# Patient Record
Sex: Female | Born: 1970 | Race: White | Hispanic: No | Marital: Married | Smoking: Never smoker
Health system: Southern US, Community
[De-identification: ages and names within clinical notes are randomized; demographics above are authoritative.]

## PROBLEM LIST (undated history)

## (undated) ENCOUNTER — Ambulatory Visit

## (undated) DIAGNOSIS — G5622 Lesion of ulnar nerve, left upper limb: Secondary | ICD-10-CM

## (undated) HISTORY — DX: Lesion of ulnar nerve, left upper limb: G56.22

## (undated) HISTORY — PX: WISDOM TOOTH EXTRACTION: SHX21

---

## 2009-06-13 ENCOUNTER — Ambulatory Visit: Payer: Self-pay | Admitting: Occupational Medicine

## 2009-10-13 ENCOUNTER — Ambulatory Visit: Payer: Self-pay | Admitting: Family Medicine

## 2009-10-13 LAB — CONVERTED CEMR LAB
Bilirubin Urine: NEGATIVE
Nitrite: NEGATIVE
Urobilinogen, UA: 0.2
WBC Urine, dipstick: NEGATIVE
pH: 5.5

## 2011-09-05 ENCOUNTER — Emergency Department (HOSPITAL_BASED_OUTPATIENT_CLINIC_OR_DEPARTMENT_OTHER)
Admission: EM | Admit: 2011-09-05 | Discharge: 2011-09-05 | Disposition: A | Discharge status: Home / Self Care | Payer: 59 | Attending: Emergency Medicine | Admitting: Emergency Medicine

## 2011-09-05 ENCOUNTER — Encounter: Payer: Self-pay | Admitting: *Deleted

## 2011-09-05 ENCOUNTER — Inpatient Hospital Stay (INDEPENDENT_AMBULATORY_CARE_PROVIDER_SITE_OTHER)
Admission: RE | Admit: 2011-09-05 | Discharge: 2011-09-05 | Disposition: A | Discharge status: Home / Self Care | Payer: 59 | Source: Ambulatory Visit | Attending: Family Medicine | Admitting: Family Medicine

## 2011-09-05 ENCOUNTER — Encounter: Payer: Self-pay | Admitting: Family Medicine

## 2011-09-05 DIAGNOSIS — IMO0001 Reserved for inherently not codable concepts without codable children: Secondary | ICD-10-CM | POA: Insufficient documentation

## 2011-09-05 DIAGNOSIS — W5501XA Bitten by cat, initial encounter: Secondary | ICD-10-CM

## 2011-09-05 DIAGNOSIS — IMO0002 Reserved for concepts with insufficient information to code with codable children: Secondary | ICD-10-CM | POA: Insufficient documentation

## 2011-09-05 DIAGNOSIS — Y92009 Unspecified place in unspecified non-institutional (private) residence as the place of occurrence of the external cause: Secondary | ICD-10-CM | POA: Insufficient documentation

## 2011-09-05 DIAGNOSIS — T07XXXA Unspecified multiple injuries, initial encounter: Secondary | ICD-10-CM

## 2011-09-05 NOTE — ED Notes (Signed)
Stray cat bite on R hand, patient has cat at her house

## 2011-09-05 NOTE — ED Provider Notes (Signed)
History     CSN: [id number] Arrival date & time: 09/05/2011  8:52 AM  Chief Complaint  Patient presents with  . Animal Bite    (Consider location/radiation/quality/duration/timing/severity/associated sxs/prior treatment) Patient is a 39 y.o. female presenting with animal bite. The history is provided by the patient.  Animal Bite    pleasant female with no past medical history. She was scratched or bitten on the right wrist by a stray cat that she found that she is planning to keep. Patient was concerned that the kitten would need to be used denies. However the cat has been acting normal. She has had no fevers no nausea no vomiting no redness or any streaking in the arm. Will talk with registration and have patient fill out forms to observe the cat. Her tetanus booster is up to date. It is very unlikely the patient will need the rabies vaccine series.  History reviewed. No pertinent past medical history.  Past Surgical History  Procedure Date  . Wisdom tooth extraction     No family history on file.  History  Substance Use Topics  . Smoking status: Never Smoker   . Smokeless tobacco: Not on file  . Alcohol Use: Yes    OB History    Grav Para Term Preterm Abortions TAB SAB Ect Mult Living                  Review of Systems  All other systems reviewed and are negative.    Allergies  Review of patient's allergies indicates no known allergies.  Home Medications  No current outpatient prescriptions on file.  BP 119/75  Pulse 86  Temp(Src) 97.9 F (36.6 C) (Oral)  Resp 16  SpO2 100%  Physical Exam  Constitutional: She is oriented to person, place, and time. She appears well-developed and well-nourished.  HENT:  Head: Normocephalic and atraumatic.  Eyes: Conjunctivae and EOM are normal. Pupils are equal, round, and reactive to light.  Neck: Neck supple.  Cardiovascular: Normal rate and regular rhythm.  Exam reveals no gallop and no friction rub.   No murmur  heard. Pulmonary/Chest: Breath sounds normal. She has no wheezes. She has no rales. She exhibits no tenderness.  Abdominal: Soft. Bowel sounds are normal. She exhibits no distension. There is no tenderness. There is no rebound and no guarding.  Musculoskeletal: Normal range of motion.  Neurological: She is alert and oriented to person, place, and time. No cranial nerve deficit. Coordination normal.  Skin: Skin is warm and dry. No rash noted.       Very small abrasion to the right lateral wrist. There is no surrounding erythema there is NO fluctuance. There is NO crepitus. There is no lymphangitis.  Psychiatric: She has a normal mood and affect.    ED Course  Procedures (including critical care time)  Labs Reviewed - No data to display No results found.   No diagnosis found.    MDM  Pt is seen and examined;  Initial history and physical completed.  Will follow.          Lithzy Bernard A. Patrica Duel, MD 09/05/11 [phone]

## 2011-09-05 NOTE — ED Notes (Signed)
I irrigated wound with i.v. Tube and saline. I then applied bacitracin, then wrapped kerlix over 2x2's.

## 2011-11-05 NOTE — Progress Notes (Signed)
Summary: animal bite on r wrist/wb   Vital Signs:  Patient Profile:   40 Years Old Female Height:     66 inches Weight:      146 pounds O2 Sat:      100 % O2 treatment:    Room Air Temp:     97.6 degrees F oral Pulse rate:   83 / minute Pulse rhythm:   regular Resp:     16 per minute  Vitals Entered By: Emilio Math (September 05, 2011 8:25 AM)                  Current Allergies: No known allergies    Objective:  Patient not examined Assessment New Problems: ANIMAL BITE (ICD-919.8)  PATIENT SENT TO ER BECAUSE OF POSSIBLE RABIES EXPOSURE  The patient and/or caregiver has been counseled thoroughly with regard to medications prescribed including dosage, schedule, interactions, rationale for use, and possible side effects and they verbalize understanding.  Diagnoses and expected course of recovery discussed and will return if not improved as expected or if the condition worsens. Patient and/or caregiver verbalized understanding.   Orders Added: 1)  No Charge Patient Arrived (NCPA0) [NCPA0]

## 2012-05-28 ENCOUNTER — Other Ambulatory Visit (HOSPITAL_COMMUNITY)
Admission: RE | Admit: 2012-05-28 | Discharge: 2012-05-28 | Disposition: A | Discharge status: Home / Self Care | Payer: 59 | Source: Ambulatory Visit | Attending: Obstetrics & Gynecology | Admitting: Obstetrics & Gynecology

## 2012-05-28 ENCOUNTER — Other Ambulatory Visit (HOSPITAL_COMMUNITY): Payer: Self-pay | Admitting: Obstetrics & Gynecology

## 2012-05-28 DIAGNOSIS — Z3480 Encounter for supervision of other normal pregnancy, unspecified trimester: Secondary | ICD-10-CM | POA: Insufficient documentation

## 2012-05-28 DIAGNOSIS — Q513 Bicornate uterus: Secondary | ICD-10-CM

## 2012-05-28 DIAGNOSIS — Z01419 Encounter for gynecological examination (general) (routine) without abnormal findings: Secondary | ICD-10-CM | POA: Insufficient documentation

## 2012-05-28 DIAGNOSIS — Z113 Encounter for screening for infections with a predominantly sexual mode of transmission: Secondary | ICD-10-CM | POA: Insufficient documentation

## 2012-05-28 DIAGNOSIS — Z3682 Encounter for antenatal screening for nuchal translucency: Secondary | ICD-10-CM

## 2012-05-28 DIAGNOSIS — Z1151 Encounter for screening for human papillomavirus (HPV): Secondary | ICD-10-CM | POA: Insufficient documentation

## 2012-06-16 ENCOUNTER — Encounter (HOSPITAL_COMMUNITY): Payer: Self-pay | Admitting: Obstetrics & Gynecology

## 2012-06-25 ENCOUNTER — Ambulatory Visit (HOSPITAL_COMMUNITY)
Admission: RE | Admit: 2012-06-25 | Discharge: 2012-06-25 | Disposition: A | Discharge status: Home / Self Care | Payer: 59 | Source: Ambulatory Visit | Attending: Obstetrics & Gynecology | Admitting: Obstetrics & Gynecology

## 2012-06-25 ENCOUNTER — Encounter (HOSPITAL_COMMUNITY): Payer: Self-pay

## 2012-06-25 ENCOUNTER — Other Ambulatory Visit: Payer: Self-pay

## 2012-06-25 DIAGNOSIS — Z8751 Personal history of pre-term labor: Secondary | ICD-10-CM | POA: Insufficient documentation

## 2012-06-25 DIAGNOSIS — O352XX0 Maternal care for (suspected) hereditary disease in fetus, not applicable or unspecified: Secondary | ICD-10-CM | POA: Insufficient documentation

## 2012-06-25 DIAGNOSIS — Q513 Bicornate uterus: Secondary | ICD-10-CM

## 2012-06-25 DIAGNOSIS — Z3682 Encounter for antenatal screening for nuchal translucency: Secondary | ICD-10-CM

## 2012-06-25 DIAGNOSIS — O09529 Supervision of elderly multigravida, unspecified trimester: Secondary | ICD-10-CM

## 2012-06-25 NOTE — Progress Notes (Signed)
Genetic Counseling  High-Risk Gestation Note  Appointment Date:  06/25/2012 Referred By: Delbert Harness, MD Date of Birth:  [birth year]  Pregnancy History: [id number] Estimated Date of Delivery: 01/07/13 Estimated Gestational Age: [redacted]w[redacted]d Attending: Particia Nearing, MD    Cynthia Alvarez was seen for genetic counseling because of a maternal age of 41.   She was counseled regarding maternal age and the association with risk for chromosome conditions due to nondisjunction with aging of the ova.   We reviewed chromosomes, nondisjunction, and the associated 1 in 50 risk for fetal aneuploidy related to a maternal age of 41 y.o. at [redacted]w[redacted]d gestation.  She was counseled that the risk for aneuploidy decreases as gestational age increases, accounting for those pregnancies which spontaneously abort.  We specifically discussed Down syndrome (trisomy 20), trisomies 24 and 72, and sex chromosome aneuploidies (47,XXX and 47,XXY) including the common features and prognoses of each.   We reviewed available screening options including First screen, Quad screen, noninvasive prenatal testing (NIPT), and detailed ultrasound. She understands that screening tests are used to modify a patient's a priori risk for aneuploidy, typically based on age.  This estimate provides a pregnancy specific risk assessment.  Specifically, we discussed that First screen is a combined test in which specific ultrasound measurements and pregnancy specific protein analysis detects approximately 95% of fetuses with trisomies 21, 13, and 18.  The reported false positive rate is ~5%.  We then discussed that NIPT analyzes cell free fetal DNA found in the maternal circulation. This test is not diagnostic for chromosome conditions, but can provide information regarding the presence or absence of extra fetal DNA for chromosomes 13, 18, 21, X, and Y, and missing fetal DNA for chromosome X and Y (Turner syndrome). Thus, it would not identify or rule out all  genetic conditions. The reported detection rate is greater than 99% for Trisomy 21, greater than 98% for Trisomy 18, and is approximately 80% (8 out of 10) for Trisomy 13. The false positive rate is reported to be less than 0.1% for any of these conditions.  In addition, we discussed that ~50-80% of fetuses with Down syndrome and up to 90-95% of fetuses with trisomy 18/13, when well visualized, have detectable anomalies or soft markers by detailed ultrasound (~18+ weeks gestation).   She was also counseled regarding diagnostic testing via CVS or amniocentesis.  We reviewed the associated risk for complications for each, including spontaneous pregnancy loss. We discussed the risks, limitations, and benefits of each screening and testing option. After consideration of all the options, [name] elected to proceed with First trimester screening. Those results will be available in ~5 business days.  A nuchal translucency ultrasound was performed today. The result will be documented separately.  [name] was provided with written information regarding cystic fibrosis (CF) including the carrier frequency and incidence in the Caucasian population, the availability of carrier testing and prenatal diagnosis if indicated.  In addition, we discussed that CF is routinely screened for as part of the [place] newborn screening panel.  She declined testing today.   Both family histories were reviewed and found to be contributory for [name]'s second child having sagittal craniosynostosis.  This child reportedly had surgery at 21 months of age.  He has had no other medical or developmental concerns.  He has never been evaluated by a medical geneticist.  She was counseled that the majority of cases of craniosynostosis are multifactorial in etiology; however, craniosynostosis can be a feature of an  underlying genetic syndrome.  Assuming multifactorial inheritance, the risk of recurrence is estimated to be ~3-5%.  If her son  has a specific undiagnosed syndrome, the risk of recurrence depends on the inheritance of the syndrome. The remainder of the reported family history was noncontributory for birth defects, mental retardation, and known genetic conditions. Without further information regarding the provided family history, an accurate genetic risk cannot be calculated. Further genetic counseling is warranted if more information is obtained.  Mrs. Backs denied exposure to environmental toxins or chemical agents. She denied the use of alcohol, tobacco or street drugs. She denied significant viral illnesses during the course of her pregnancy.   I counseled [name] regarding the above risks and available options.  The approximate face-to-face time with the genetic counselor was 35 minutes.  Despina Arias, MS  Certified Genetic Counselor

## 2012-06-27 LAB — OB RESULTS CONSOLE HEPATITIS B SURFACE ANTIGEN: Hepatitis B Surface Ag: NEGATIVE

## 2012-06-27 LAB — OB RESULTS CONSOLE ABO/RH: RH Type: NEGATIVE

## 2012-06-27 LAB — OB RESULTS CONSOLE ANTIBODY SCREEN: Antibody Screen: NEGATIVE

## 2012-08-06 ENCOUNTER — Other Ambulatory Visit (HOSPITAL_COMMUNITY): Payer: Self-pay

## 2012-08-08 ENCOUNTER — Ambulatory Visit (HOSPITAL_COMMUNITY)
Admission: RE | Admit: 2012-08-08 | Discharge: 2012-08-08 | Disposition: A | Discharge status: Home / Self Care | Payer: Commercial Managed Care - PPO | Source: Ambulatory Visit | Attending: Obstetrics & Gynecology | Admitting: Obstetrics & Gynecology

## 2012-08-08 DIAGNOSIS — Z363 Encounter for antenatal screening for malformations: Secondary | ICD-10-CM | POA: Insufficient documentation

## 2012-08-08 DIAGNOSIS — Z8751 Personal history of pre-term labor: Secondary | ICD-10-CM | POA: Insufficient documentation

## 2012-08-08 DIAGNOSIS — O3400 Maternal care for unspecified congenital malformation of uterus, unspecified trimester: Secondary | ICD-10-CM | POA: Insufficient documentation

## 2012-08-08 DIAGNOSIS — O09529 Supervision of elderly multigravida, unspecified trimester: Secondary | ICD-10-CM

## 2012-08-08 DIAGNOSIS — O358XX0 Maternal care for other (suspected) fetal abnormality and damage, not applicable or unspecified: Secondary | ICD-10-CM | POA: Insufficient documentation

## 2012-08-08 DIAGNOSIS — Z1389 Encounter for screening for other disorder: Secondary | ICD-10-CM | POA: Insufficient documentation

## 2012-08-08 DIAGNOSIS — O352XX0 Maternal care for (suspected) hereditary disease in fetus, not applicable or unspecified: Secondary | ICD-10-CM | POA: Insufficient documentation

## 2012-09-12 ENCOUNTER — Encounter: Payer: Self-pay | Admitting: Family Medicine

## 2012-09-12 ENCOUNTER — Ambulatory Visit (INDEPENDENT_AMBULATORY_CARE_PROVIDER_SITE_OTHER): Payer: 59 | Admitting: Family Medicine

## 2012-09-12 VITALS — BP 110/67 | HR 91 | Temp 97.4°F | Resp 18 | Ht 66.0 in | Wt 154.0 lb

## 2012-09-12 DIAGNOSIS — G5622 Lesion of ulnar nerve, left upper limb: Secondary | ICD-10-CM | POA: Insufficient documentation

## 2012-09-12 DIAGNOSIS — G5620 Lesion of ulnar nerve, unspecified upper limb: Secondary | ICD-10-CM

## 2012-09-12 HISTORY — DX: Lesion of ulnar nerve, left upper limb: G56.22

## 2012-09-12 NOTE — Progress Notes (Signed)
CC: Cynthia Alvarez is a 41 y.o. female is here for Tingling   Subjective: HPI:  Pleasant 41 year old female approximately [redacted] weeks gestation presents with a complaint of left elbow pain and numbness in the left forearm is been present for a little over 12 hours. It came on somewhat suddenly while watching TV last night and was not preceded by any trauma recently normally. She's never had this discomfort before. She has had 3 dislocations of left shoulder many years ago without any neurological issues or motor/sensory disturbances.  She describes a tingling sensation that has been running from the medial elbow distally into the fourth and fifth digit. She denies weakness in the elbow wrist flexors wrist extensors nor anywhere in her fingers or hand. She states the pain and discomfort is identical to that when she has hit her "funny bone". It has improved this morning and it may be because of taking Tylenol but there been no other interventions. She states she was always sleeps on her left side with her elbow flexed. She denies skin changes in the affected extremity, swollen joints, redness of the joints. She denies left shoulder pain left wrist pain nor neck pain.   Review Of Systems Outlined In HPI  History reviewed. No pertinent past medical history.   Family History  Problem Relation Age of Onset  . Hypertension Mother   . Heart attack Maternal Grandmother   . Heart attack Maternal Grandfather   . Stroke Maternal Grandfather   . Heart attack Paternal Grandmother   . Heart attack Paternal Grandfather      History  Substance Use Topics  . Smoking status: Never Smoker   . Smokeless tobacco: Never Used  . Alcohol Use: No     Objective: Filed Vitals:   09/12/12 1018  BP: 110/67  Pulse: 91  Temp: 97.4 F (36.3 C)  Resp: 18    General: Alert and Oriented, No Acute Distress Neck: Full range of motion strength Lungs: Clear to auscultation bilaterally, no wheezing/ronchi/rales.   Comfortable work of breathing.  Extremities: No peripheral edema.  Strong peripheral pulses.  MSK: Left upper extremity: Full range of motion and strength in the fingers, wrist, elbow, shoulder. Light touch sensation is preserved in all upper extremity dermatomes. No pain with valgus or varus stressing of the elbow. No palpable pain at the articular surfaces of the ulna, radius, humerus. No pain with resisted wrist extension nor flexion or palpation of the medial or lateral humeral condyle. Tapping of the ulnar canal causes mild discomfort similar to her presenting discomfort. There's no reproduction of symptoms with Phalen's test. Mental Status: No depression, anxiety, nor agitation. Skin: Warm and dry.  Assessment & Plan: Cynthia Alvarez was seen today for tingling.  Diagnoses and associated orders for this visit:  Ulnar neuropathy of left upper extremity  History and presentation suggestive of ulnar neuropathy, suspicious of cubital tunnel syndrome therefore conservative therapy will consist of discouraging the left elbow from being in flexion. Without trauma I doubt x-rays be of any utility especially given that she's pregnant. He consider nerve conduction studies if conservative therapy does not help, she'll return next week for construction of a fiberglass splint if she doesn't improve with a towel splint wrapped in Ace bandages to be worn at times when she would be in elbow flexion such as sleeping and resting.  Return if symptoms worsen or fail to improve.

## 2012-10-16 LAB — OB RESULTS CONSOLE RPR: RPR: NONREACTIVE

## 2012-12-13 ENCOUNTER — Encounter (HOSPITAL_COMMUNITY): Payer: Self-pay | Admitting: *Deleted

## 2012-12-13 ENCOUNTER — Inpatient Hospital Stay (HOSPITAL_COMMUNITY)
Admission: AD | Admit: 2012-12-13 | Discharge: 2012-12-13 | Disposition: A | Discharge status: Home / Self Care | Payer: 59 | Source: Ambulatory Visit | Attending: Obstetrics & Gynecology | Admitting: Obstetrics & Gynecology

## 2012-12-13 DIAGNOSIS — O4700 False labor before 37 completed weeks of gestation, unspecified trimester: Secondary | ICD-10-CM | POA: Insufficient documentation

## 2012-12-13 NOTE — Progress Notes (Signed)
No pain with contractions - only pressure

## 2012-12-13 NOTE — MAU Note (Signed)
I've been contracting all day but stronger since 1730. No bleeding or leaking. Last delivery water broke at 35+wks and delivered that day

## 2012-12-20 ENCOUNTER — Inpatient Hospital Stay (HOSPITAL_COMMUNITY)
Admission: AD | Admit: 2012-12-20 | Discharge: 2012-12-22 | DRG: 775 | Disposition: A | Discharge status: Home / Self Care | Payer: 59 | Source: Ambulatory Visit | Attending: Obstetrics and Gynecology | Admitting: Obstetrics and Gynecology

## 2012-12-20 ENCOUNTER — Encounter (HOSPITAL_COMMUNITY): Payer: Self-pay | Admitting: Obstetrics and Gynecology

## 2012-12-20 DIAGNOSIS — O3400 Maternal care for unspecified congenital malformation of uterus, unspecified trimester: Secondary | ICD-10-CM | POA: Diagnosis present

## 2012-12-20 DIAGNOSIS — O09529 Supervision of elderly multigravida, unspecified trimester: Principal | ICD-10-CM | POA: Diagnosis present

## 2012-12-20 DIAGNOSIS — Q513 Bicornate uterus: Secondary | ICD-10-CM

## 2012-12-20 LAB — CBC
HCT: 35.1 % — ABNORMAL LOW (ref 36.0–46.0)
MCH: 31.9 pg (ref 26.0–34.0)
MCHC: 34.8 g/dL (ref 30.0–36.0)
MCV: 91.6 fL (ref 78.0–100.0)
RDW: 14.8 % (ref 11.5–15.5)

## 2012-12-20 MED ORDER — OXYTOCIN BOLUS FROM INFUSION
500.0000 mL | INTRAVENOUS | Status: DC
  Administered 2012-12-21: 500 mL via INTRAVENOUS

## 2012-12-20 MED ORDER — IBUPROFEN 600 MG PO TABS
600.0000 mg | ORAL_TABLET | Freq: Four times a day (QID) | ORAL | Status: DC | PRN
  Administered 2012-12-21: 600 mg via ORAL
  Filled 2012-12-20: qty 1

## 2012-12-20 MED ORDER — ONDANSETRON HCL 4 MG/2ML IJ SOLN: 4.0000 mg | Freq: Four times a day (QID) | INTRAMUSCULAR | Status: DC | PRN

## 2012-12-20 MED ORDER — LIDOCAINE HCL (PF) 1 % IJ SOLN
30.0000 mL | INTRAMUSCULAR | Status: DC | PRN
  Filled 2012-12-20: qty 30

## 2012-12-20 MED ORDER — LACTATED RINGERS IV SOLN
INTRAVENOUS | Status: DC
  Administered 2012-12-20: 22:00:00 via INTRAVENOUS

## 2012-12-20 MED ORDER — CITRIC ACID-SODIUM CITRATE 334-500 MG/5ML PO SOLN: 30.0000 mL | ORAL | Status: DC | PRN

## 2012-12-20 MED ORDER — ACETAMINOPHEN 325 MG PO TABS: 650.0000 mg | ORAL_TABLET | ORAL | Status: DC | PRN

## 2012-12-20 MED ORDER — OXYTOCIN 40 UNITS IN LACTATED RINGERS INFUSION - SIMPLE MED
62.5000 mL/h | INTRAVENOUS | Status: DC
  Filled 2012-12-20: qty 1000

## 2012-12-20 MED ORDER — LACTATED RINGERS IV SOLN: 500.0000 mL | INTRAVENOUS | Status: DC | PRN

## 2012-12-20 NOTE — MAU Note (Signed)
"  I have been contracting pretty strongly since about 1700 tonight.  I was seen in the office yesterday and was dilated 3.5cm.  No VB or LOF today.  The baby was rally active this morning, but hasn't been very active in the last couple of hours."

## 2012-12-20 NOTE — MAU Note (Signed)
Cxts all day. Stronger since 1700. No bleeding or leaking. 3.5cm yest.

## 2012-12-21 ENCOUNTER — Encounter (HOSPITAL_COMMUNITY): Payer: Self-pay | Admitting: Obstetrics

## 2012-12-21 LAB — ABO/RH: ABO/RH(D): A NEG

## 2012-12-21 MED ORDER — OXYCODONE-ACETAMINOPHEN 5-325 MG PO TABS: 1.0000 | ORAL_TABLET | ORAL | Status: DC | PRN

## 2012-12-21 MED ORDER — PRENATAL MULTIVITAMIN CH
1.0000 | ORAL_TABLET | Freq: Every day | ORAL | Status: DC
  Administered 2012-12-21 – 2012-12-22 (×2): 1 via ORAL
  Filled 2012-12-21 (×2): qty 1

## 2012-12-21 MED ORDER — DIBUCAINE 1 % RE OINT
1.0000 "application " | TOPICAL_OINTMENT | RECTAL | Status: DC | PRN
  Administered 2012-12-21: 1 via RECTAL
  Filled 2012-12-21: qty 28

## 2012-12-21 MED ORDER — WITCH HAZEL-GLYCERIN EX PADS
1.0000 "application " | MEDICATED_PAD | CUTANEOUS | Status: DC | PRN
  Administered 2012-12-21: 1 via TOPICAL

## 2012-12-21 MED ORDER — MEDROXYPROGESTERONE ACETATE 150 MG/ML IM SUSP: 150.0000 mg | INTRAMUSCULAR | Status: DC | PRN

## 2012-12-21 MED ORDER — MEASLES, MUMPS & RUBELLA VAC ~~LOC~~ INJ
0.5000 mL | INJECTION | Freq: Once | SUBCUTANEOUS | Status: DC
  Filled 2012-12-21: qty 0.5

## 2012-12-21 MED ORDER — ONDANSETRON HCL 4 MG/2ML IJ SOLN: 4.0000 mg | INTRAMUSCULAR | Status: DC | PRN

## 2012-12-21 MED ORDER — BENZOCAINE-MENTHOL 20-0.5 % EX AERO
1.0000 "application " | INHALATION_SPRAY | CUTANEOUS | Status: DC | PRN
  Administered 2012-12-21: 1 via TOPICAL
  Filled 2012-12-21: qty 56

## 2012-12-21 MED ORDER — ZOLPIDEM TARTRATE 5 MG PO TABS: 5.0000 mg | ORAL_TABLET | Freq: Every evening | ORAL | Status: DC | PRN

## 2012-12-21 MED ORDER — DIPHENHYDRAMINE HCL 25 MG PO CAPS: 25.0000 mg | ORAL_CAPSULE | Freq: Four times a day (QID) | ORAL | Status: DC | PRN

## 2012-12-21 MED ORDER — LANOLIN HYDROUS EX OINT: TOPICAL_OINTMENT | CUTANEOUS | Status: DC | PRN

## 2012-12-21 MED ORDER — SENNOSIDES-DOCUSATE SODIUM 8.6-50 MG PO TABS
2.0000 | ORAL_TABLET | Freq: Every day | ORAL | Status: DC
  Administered 2012-12-21: 2 via ORAL

## 2012-12-21 MED ORDER — TETANUS-DIPHTH-ACELL PERTUSSIS 5-2.5-18.5 LF-MCG/0.5 IM SUSP: 0.5000 mL | Freq: Once | INTRAMUSCULAR | Status: DC

## 2012-12-21 MED ORDER — IBUPROFEN 600 MG PO TABS
600.0000 mg | ORAL_TABLET | Freq: Four times a day (QID) | ORAL | Status: DC
  Administered 2012-12-21 – 2012-12-22 (×5): 600 mg via ORAL
  Filled 2012-12-21 (×5): qty 1

## 2012-12-21 MED ORDER — MULTI-VITAMIN/MINERALS PO TABS: 1.0000 | ORAL_TABLET | Freq: Every day | ORAL | Status: DC

## 2012-12-21 MED ORDER — SIMETHICONE 80 MG PO CHEW: 80.0000 mg | CHEWABLE_TABLET | ORAL | Status: DC | PRN

## 2012-12-21 MED ORDER — ONDANSETRON HCL 4 MG PO TABS: 4.0000 mg | ORAL_TABLET | ORAL | Status: DC | PRN

## 2012-12-21 NOTE — H&P (Signed)
Cynthia Alvarez is a 42 y.o. female presenting for labor at 36 1/2 weeks.  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  She received 17 P injections until 35 weeks.  OB History    Grav Para Term Preterm Abortions TAB SAB Ect Mult Living   5 5 4 1  0 0 0 0 0 5     History reviewed. No pertinent past medical history. Past Surgical History  Procedure Date  . Wisdom tooth extraction    Family History: family history includes Heart attack in her maternal grandfather, maternal grandmother, paternal grandfather, and paternal grandmother; Hypertension in her mother; and Stroke in her maternal grandfather. Social History:  reports that she has never smoked. She has never used smokeless tobacco. She reports that she does not drink alcohol or use illicit drugs.  Non contributory  Physical exam:  General  Well develped, no distress Heart  S1 and S2 clear Lungs clear  Abd BS present Term gravida Ext normal with out swelling, discoloration  Cx on admission 3.5 cm   Blood pressure 119/74, pulse 82, temperature 98.2 F (36.8 C), temperature source Oral, resp. rate 18, height 5\' 6"  (1.676 m), weight 77.111 kg (170 lb), last menstrual period 04/02/2012, unknown if currently breastfeeding.  Prenatal labs: ABO, Rh: --/--/A NEG (01/19 0347) Antibody: POS (01/19 0347) Rubella:   RPR: NON REACTIVE (01/18 2200)  HBsAg: Negative (07/26 0000)  HIV: Non-reactive (11/14 0000)  GBS: Negative (01/10 0000)   Assessment/Plan: Term, labor  Expectant care   Bonne Whack E 12/21/2012, 5:37 AM

## 2012-12-21 NOTE — Progress Notes (Signed)
Delivery Note At 4:55 AM a viable female was delivered via Vaginal, Spontaneous Delivery (Presentation: Middle Occiput Posterior).  APGAR: 8, 9; weight .   Placenta status: Intact, Spontaneous.  Cord: 3 vessels with the following complications: None.    Anesthesia: None  Episiotomy: None Lacerations: None Suture Repair: no Est. Blood Loss (mL): 450  Mom to postpartum.  Baby to nursery-stable.  Mignonne Afonso E 12/21/2012, 5:46 AM

## 2012-12-22 LAB — TYPE AND SCREEN
ABO/RH(D): A NEG
DAT, IgG: NEGATIVE
Unit division: 0

## 2012-12-22 LAB — CBC
HCT: 31.7 % — ABNORMAL LOW (ref 36.0–46.0)
Hemoglobin: 10.7 g/dL — ABNORMAL LOW (ref 12.0–15.0)
MCHC: 33.8 g/dL (ref 30.0–36.0)
MCV: 92.7 fL (ref 78.0–100.0)
WBC: 13.1 10*3/uL — ABNORMAL HIGH (ref 4.0–10.5)

## 2012-12-22 NOTE — Discharge Summary (Signed)
Obstetric Discharge Summary Reason for Admission: onset of labor Prenatal Procedures: none Intrapartum Procedures: spontaneous vaginal delivery Postpartum Procedures: none Complications-Operative and Postpartum: none Hemoglobin  Date Value Range Status  12/22/2012 10.7* 12.0 - 15.0 g/dL Final     HCT  Date Value Range Status  12/22/2012 31.7* 36.0 - 46.0 % Final    Physical Exam:  General: alert, cooperative and no distress Lochia: appropriate Uterine Fundus: no problems Incision: na DVT Evaluation: No evidence of DVT seen on physical exam.  Discharge Diagnoses: Term Pregnancy-delivered  Discharge Information: Date: 12/22/2012 Activity: unrestricted Diet: routine Medications: None Condition: stable Instructions: refer to practice specific booklet Discharge to: home   Newborn Data: Live born female  Birth Weight: 8 lb 5.7 oz (3790 g) APGAR: 8, 9  Home with mother.  Cynthia Gainer H. 12/22/2012, 10:19 AM

## 2013-02-26 ENCOUNTER — Ambulatory Visit (INDEPENDENT_AMBULATORY_CARE_PROVIDER_SITE_OTHER): Payer: 59 | Admitting: Family Medicine

## 2013-02-26 ENCOUNTER — Encounter: Payer: Self-pay | Admitting: Family Medicine

## 2013-02-26 VITALS — BP 94/61 | HR 78 | Wt 143.0 lb

## 2013-02-26 DIAGNOSIS — L259 Unspecified contact dermatitis, unspecified cause: Secondary | ICD-10-CM

## 2013-02-26 DIAGNOSIS — L309 Dermatitis, unspecified: Secondary | ICD-10-CM

## 2013-02-26 MED ORDER — HYDROXYZINE HCL 25 MG PO TABS: 25.0000 mg | ORAL_TABLET | Freq: Three times a day (TID) | ORAL | Status: DC | PRN

## 2013-02-26 MED ORDER — PREDNISONE 20 MG PO TABS: ORAL_TABLET | ORAL | Status: AC

## 2013-02-26 NOTE — Progress Notes (Signed)
CC: Cynthia Alvarez is a 42 y.o. female is here for rash on face   Subjective: HPI:  Patient planes of a rash on the face is been present for about a week and a half. Is described as red, bumpy, and itchy. It came on suddenly on a Tuesday morning. Since then it has slightly gotten from a moderate to mild annoyance. No interventions as of yet. It is present 24 hours a day. Itching slightly resolved with Benadryl. Nothing else makes better or worse. No known change in personal care products. Denies history of similar issues before. Denies rashes elsewhere in the body. Denies rapid heartbeat, flushing, shortness of breath, angioedema, cough, wheezing, nor GI disturbance   Review Of Systems Outlined In HPI  Past Medical History  Diagnosis Date  . Ulnar neuropathy of left upper extremity 09/12/2012     Family History  Problem Relation Age of Onset  . Hypertension Mother   . Heart attack Maternal Grandmother   . Heart attack Maternal Grandfather   . Stroke Maternal Grandfather   . Heart attack Paternal Grandmother   . Heart attack Paternal Grandfather      History  Substance Use Topics  . Smoking status: Never Smoker   . Smokeless tobacco: Never Used  . Alcohol Use: No     Objective: Filed Vitals:   02/26/13 1111  BP: 94/61  Pulse: 78    General: Alert and Oriented, No Acute Distress HEENT: Pupils equal, round, reactive to light. Conjunctivae clear.  External ears unremarkable, Moist mucous membranes, pharynx without inflammation nor lesions.  Neck supple without palpable lymphadenopathy nor abnormal masses. Lungs: Clear to auscultation bilaterally, no wheezing/ronchi/rales.  Comfortable work of breathing. Good air movement. Cardiac: Regular rate and rhythm. Normal S1/S2.  No murmurs, rubs, nor gallops.   Extremities: No peripheral edema.  Strong peripheral pulses.  Mental Status: No depression, anxiety, nor agitation. Skin: Warm and dry. Macular papular rash with excoriations  involving majority of face sparing the forehead, hairline, eyebrows, nose, chin, eyelids.  Assessment & Plan: Cynthia Alvarez was seen today for rash on face.  Diagnoses and associated orders for this visit:  Dermatitis of face - predniSONE (DELTASONE) 20 MG tablet; Two tabs daily days 1-6, one tab daily days 7-9, half tab daily days 10-13. - Ambulatory referral to Allergy - hydrOXYzine (ATARAX/VISTARIL) 25 MG tablet; Take 1 tablet (25 mg total) by mouth 3 (three) times daily as needed for itching.    Discussed with patient mild possibility of rosacea however more likely contact dermatitis type allergic reaction. Discussed use of continuing Benadryl or trying hydroxyzine, moderate dose prednisone with taper down to help with resolution. Patient requesting allergy referral for consideration of allergy testing  Return if symptoms worsen or fail to improve.

## 2013-12-09 ENCOUNTER — Encounter: Payer: Self-pay | Admitting: Family Medicine

## 2013-12-09 ENCOUNTER — Ambulatory Visit (INDEPENDENT_AMBULATORY_CARE_PROVIDER_SITE_OTHER): Payer: 59 | Admitting: Family Medicine

## 2013-12-09 VITALS — BP 88/58 | HR 100 | Wt 137.0 lb

## 2013-12-09 DIAGNOSIS — T7840XA Allergy, unspecified, initial encounter: Secondary | ICD-10-CM

## 2013-12-09 MED ORDER — PREDNISONE 20 MG PO TABS: ORAL_TABLET | ORAL | Status: AC

## 2013-12-09 NOTE — Progress Notes (Signed)
CC: Cynthia GreavesHeather Alvarez is a 43 y.o. female is here for Rash   Subjective: HPI:  Patient reports 3 weeks of a spreading rash which is described as redness and itching that has been worsening on a weekly basis. First localized underneath the breasts has evolved to involve flanks flexor surface of the elbows and also face. She's had a similar rash in the past but only involved the face that responded to prednisone taper however returned about a month later. She was referred to an allergist after I saw her last and was told that a prick test did not determine any particular allergen that she was allergic to. [place] and was given a diagnosis of rosacea.  She's unsure what is causing today's symptoms. Nobody else in the family has similar symptoms.  She denies any change in personal care products, clubbing, jewelry, nor any other environmental exposures. She denies flushing, shortness of breath, fevers, chills, diarrhea, abdominal pain  Review Of Systems Outlined In HPI  Past Medical History  Diagnosis Date  . Ulnar neuropathy of left upper extremity 09/12/2012     Family History  Problem Relation Age of Onset  . Hypertension Mother   . Heart attack Maternal Grandmother   . Heart attack Maternal Grandfather   . Stroke Maternal Grandfather   . Heart attack Paternal Grandmother   . Heart attack Paternal Grandfather      History  Substance Use Topics  . Smoking status: Never Smoker   . Smokeless tobacco: Never Used  . Alcohol Use: No     Objective: Filed Vitals:   12/09/13 0945  BP: 88/58  Pulse: 100    General: Alert and Oriented, No Acute Distress HEENT: Pupils equal, round, reactive to light. Conjunctivae clear.  Moist mucous membranes pharynx is unremarkable no angioedema Lungs: Clear to auscultation bilaterally, no wheezing/ronchi/rales.  Comfortable work of breathing. Good air movement. Cardiac: Regular rate and rhythm. Normal S1/S2.  No  murmurs, rubs, nor gallops.   Extremities: No peripheral edema.  Strong peripheral pulses.  Mental Status: No depression, anxiety, nor agitation. Skin: Warm and dry. Mild papular rash on the face involving medial forehead, proximal nose and cheeks. Similar-appearing papular rash which is much more dense and erythematous involving both flanks, mild appearance on the lateral upper breasts  Assessment & Plan: Cynthia SetaHeather was seen today for rash.  Diagnoses and associated orders for this visit:  Hypersensitivity, initial encounter - predniSONE (DELTASONE) 20 MG tablet; Three tabs daily days 1-3, two tabs daily days 4-6, one tab daily days 7-9, half tab daily days 10-13. 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 I've asked her to take pictures of all the locations of the rash prior to then after starting prednisone, we will refer to dermatology for a second opinion with these pictures and hand  Return if symptoms worsen or fail to improve.

## 2014-01-25 ENCOUNTER — Encounter: Payer: Self-pay | Admitting: Family Medicine

## 2014-01-25 DIAGNOSIS — L719 Rosacea, unspecified: Secondary | ICD-10-CM | POA: Insufficient documentation

## 2014-02-01 IMAGING — US US OB DETAIL+14 WK
1 series · 12 of 28 positions shown · non-contrast
Comparison: none

[Series 1: us ob detail+14 wk · 0.06mm/px · 12 of 102 slices shown]
[im 4/102]
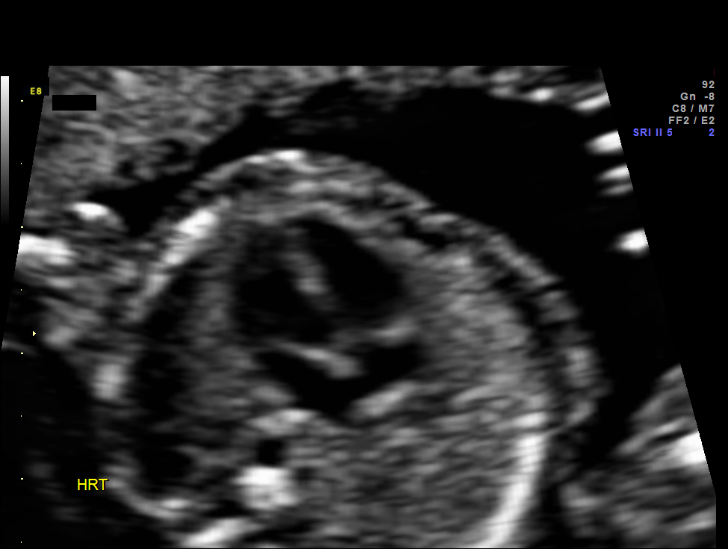
[im 12/102]
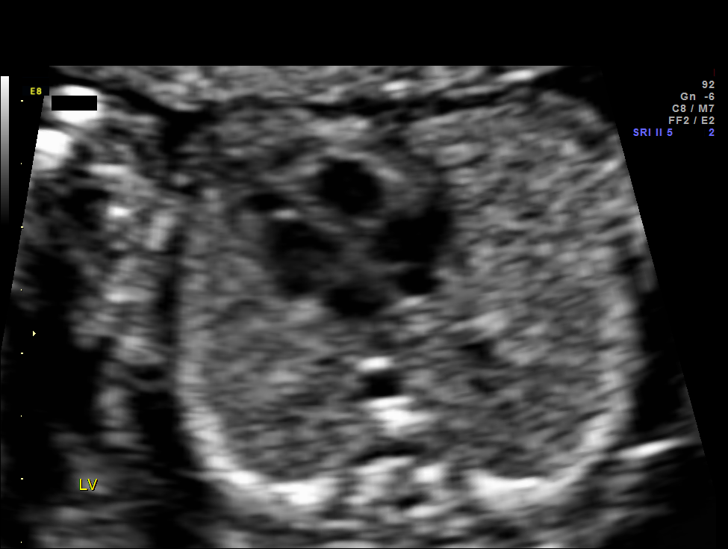
[im 19/102]
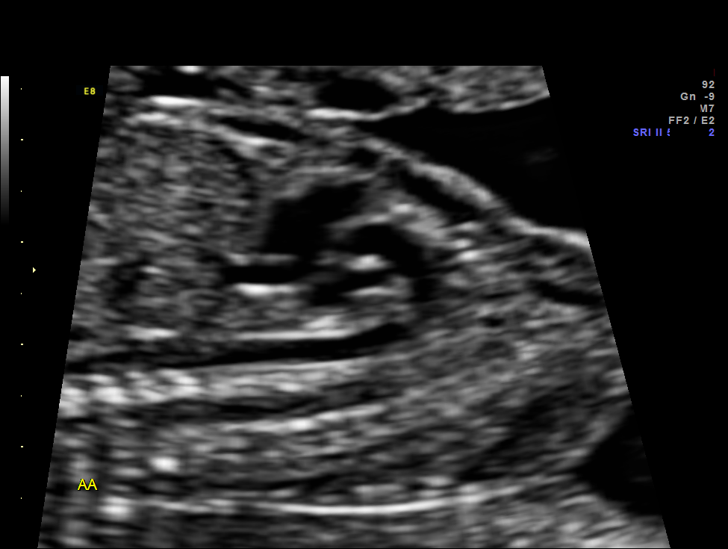
[im 30/102]
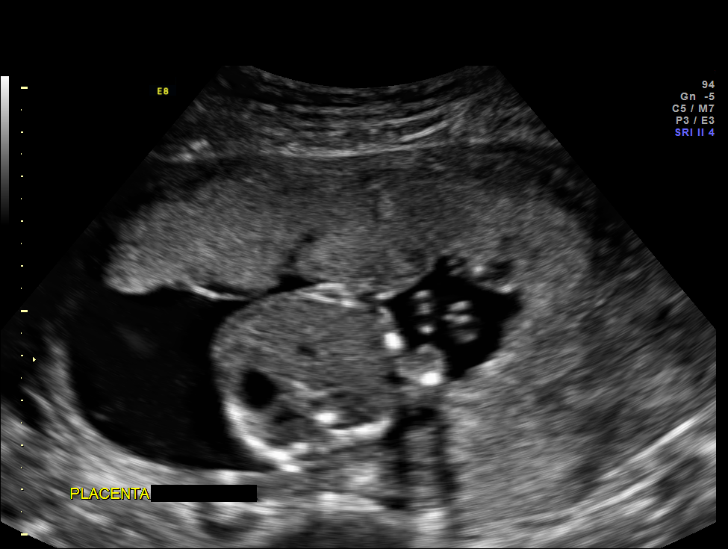
[im 38/102]
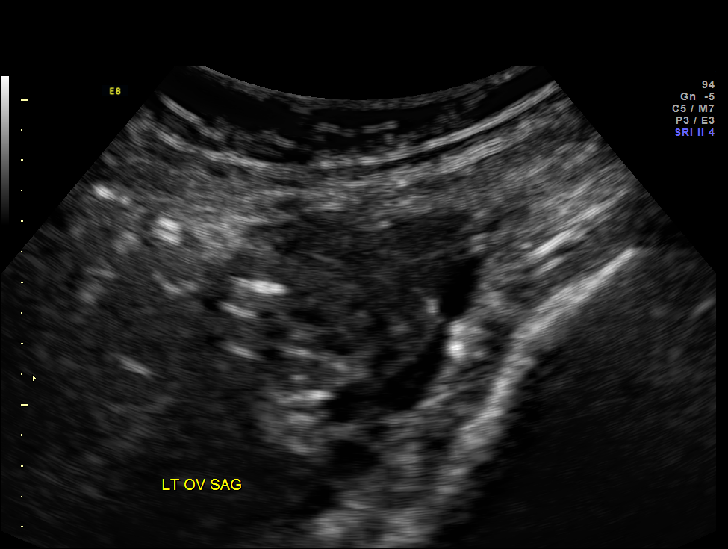
[im 45/102]
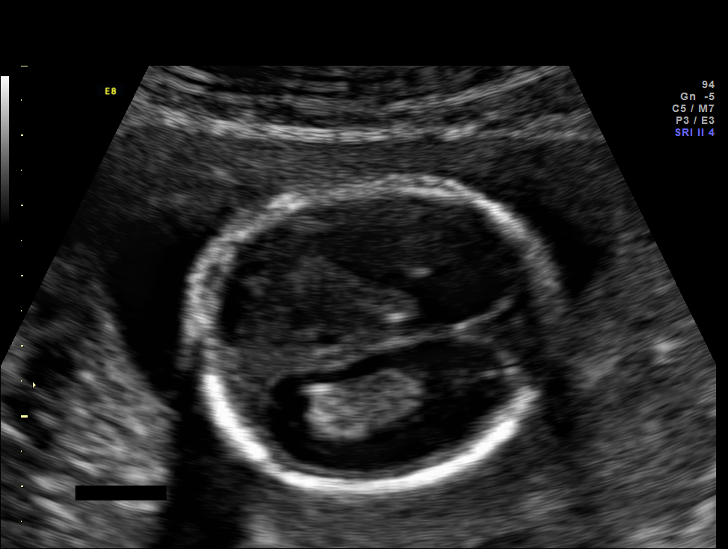
[im 57/102]
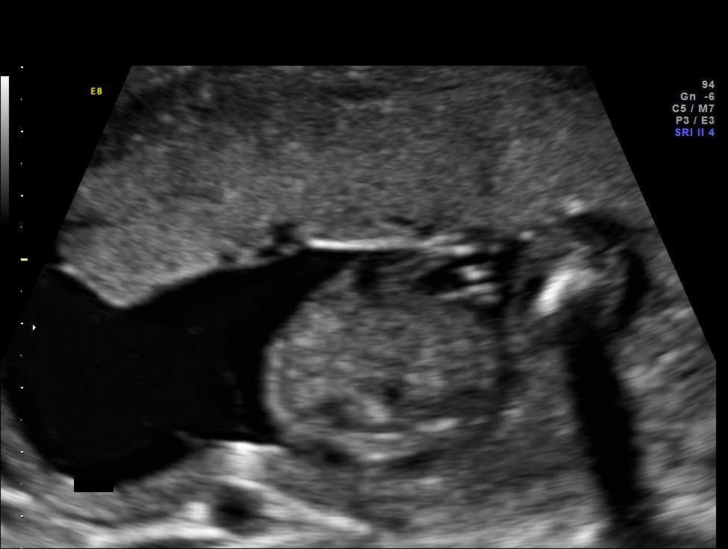
[im 64/102]
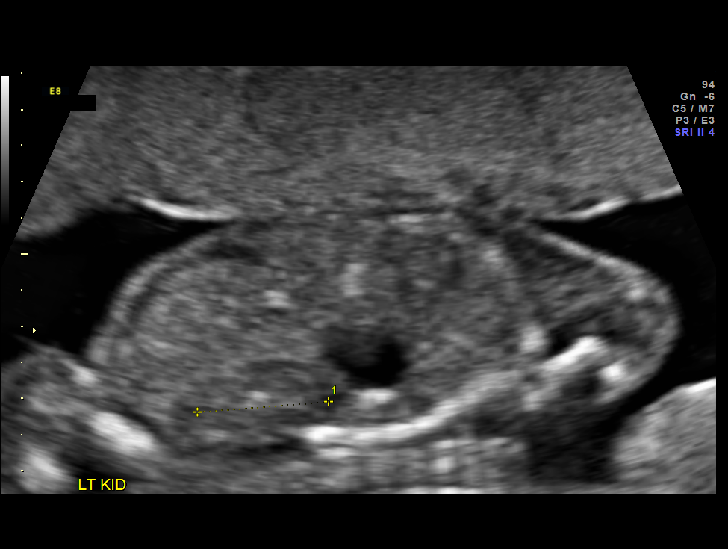
[im 72/102]
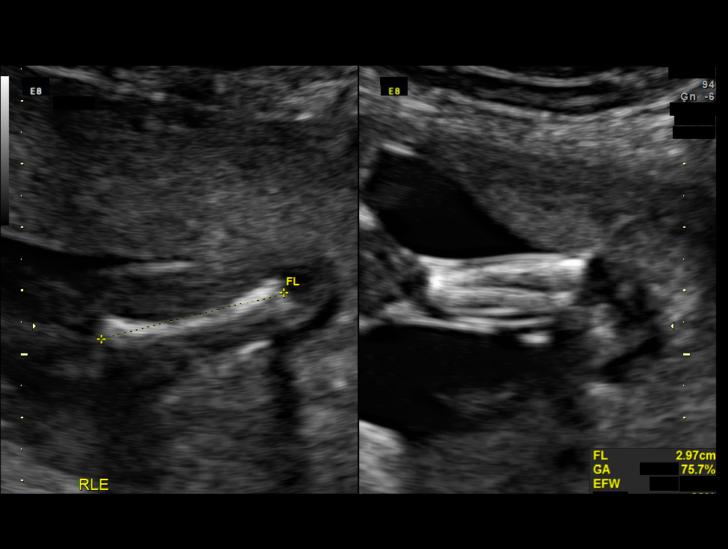
[im 83/102]
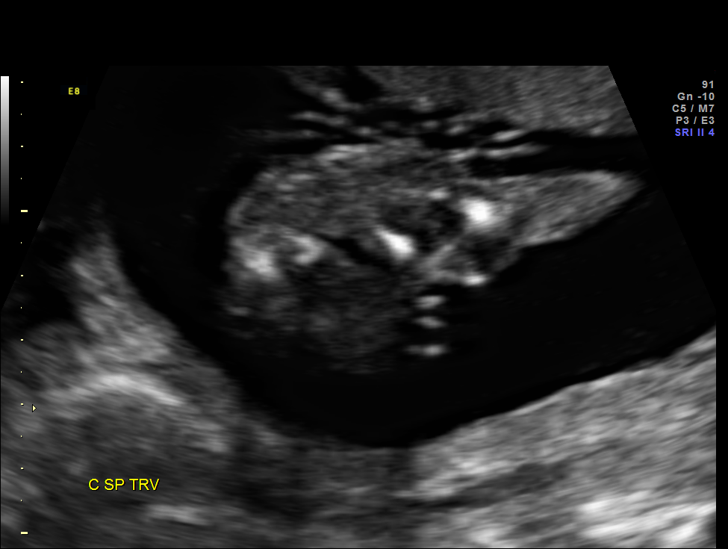
[im 90/102]
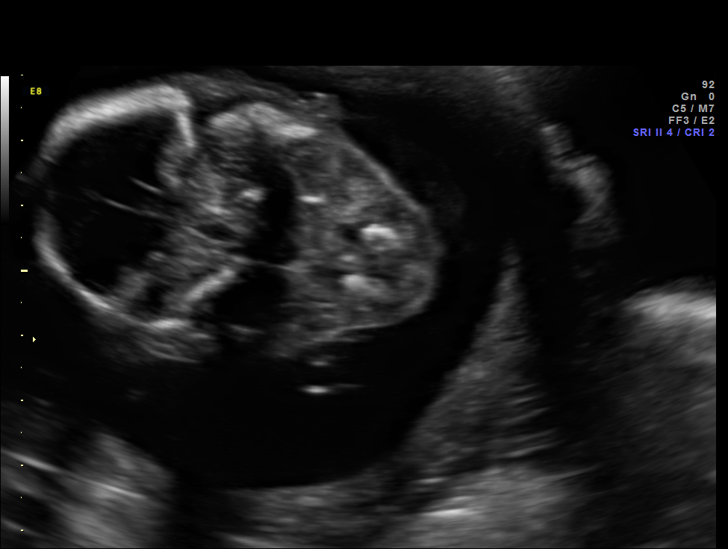
[im 98/102]
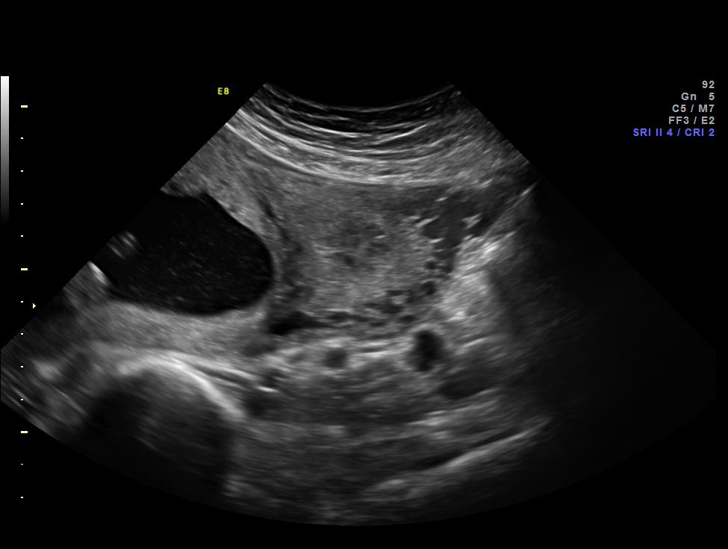

[12 of 28 positions shown; findings below may reference images not displayed]

OBSTETRICS REPORT
                      (Signed Final 08/08/2012 [DATE])

 Order#:         [id number]
Procedures

 US OB DETAIL + 14 WK                                  76811.0
Indications

 Detailed fetal anatomic survey
 Advanced maternal age (AMA), Multigravida (40)
 Poor obstetric history: Previous preterm delivery
 (35 weeks) - 17P
 History of genetic / anatomic abnormality -
 craniosynostosis (second child)
 Uterine malformation (bicornuate)
Fetal Evaluation

 Fetal Heart Rate:  148                          bpm
 Cardiac Activity:  Observed
 Presentation:      Cephalic
 Placenta:          Anterior, above cervical os
 P. Cord            Visualized
 Insertion:

 Amniotic Fluid
 AFI FV:      Subjectively within normal limits
                                             Larg Pckt:     4.9  cm
Biometry

 BPD:       44  mm     G. Age:  19w 2d                CI:         74.3   70 - 86
 OFD:     59.2  mm                                    FL/HC:      17.7   15.8 -
                                                                         18
 HC:     166.3  mm     G. Age:  19w 2d       86  %    HC/AC:      1.24   1.07 -

 AC:     134.2  mm     G. Age:  18w 6d       67  %    FL/BPD:
 FL:      29.5  mm     G. Age:  19w 1d       73  %    FL/AC:      22.0   20 - 24
 HUM:       28  mm     G. Age:  19w 0d       74  %
 CER:     19.6  mm     G. Age:  18w 6d       67  %
 NFT:      3.4  mm

 Est. FW:     270  gm    0 lb 10 oz      59  %
Gestational Age
 LMP:           18w 2d        Date:  04/02/12                 EDD:   01/07/13
 U/S Today:     19w 1d                                        EDD:   01/01/13
 Best:          18w 2d     Det. By:  LMP  (04/02/12)          EDD:   01/07/13
Anatomy

 Cranium:           Appears normal      Aortic Arch:       Appears normal
 Fetal Cavum:       Appears normal      Ductal Arch:       Appears normal
 Ventricles:        Appears normal      Diaphragm:         Appears normal
 Choroid Plexus:    Appears normal      Stomach:           Appears normal
 Cerebellum:        Appears normal      Abdomen:           Appears normal
 Posterior Fossa:   Appears normal      Abdominal Wall:    Appears nml
                                                           (cord insert,
                                                           abd wall)
 Nuchal Fold:       Appears normal      Cord Vessels:      Appears normal
                    (neck, nuchal                          (3 vessel cord)
                    fold)
 Face:              Appears normal      Kidneys:           Appear normal
                    (lips/profile/orbit
                    s)
 Heart:             Appears normal      Bladder:           Appears normal
                    (4 chamber &
                    axis)
 RVOT:              Appears normal      Spine:             Appears normal
 LVOT:              Appears normal      Limbs:             Appears normal
                                                           (hands, ankles,
                                                           feet)

 Other:     Fetus appears to be a female. Heels and 5th digit
            visualized. Nasal bone visualized.
Targeted Anatomy

 Fetal Central Nervous System
 Cisterna Magna:
Cervix Uterus Adnexa

 Cervical Length:    3.6      cm

 Cervix:       Normal appearance by transabdominal scan.
 Uterus:       Bicornuate.
 Left Ovary:    Within normal limits.
 Right Ovary:   Within normal limits. Small corpus luteum noted.
 Adnexa:     No abnormality visualized.
Impression

 IUP at 18+2 weeks
 Normal detailed fetal anatomy
 Markers of aneuploidy: none
 Normal amniotic fluid volume
 Measurements consistent with LMP dating
 Empty left horn visualized

 The US findings were shared with Ms. Bamyan. The
 implications of a normal detailed US on her T21 risk were
 discussed in detail. She had first trimester screening for
 aneuploidy. All of her prenatal testing options were reviewed.
 After careful consideration, she declined amniocentesis and
 cffDNA.
Recommendations

 Follow-up as clinically indicated

 questions or concerns.

## 2014-03-12 ENCOUNTER — Encounter: Payer: Self-pay | Admitting: Family Medicine

## 2014-03-12 ENCOUNTER — Ambulatory Visit (INDEPENDENT_AMBULATORY_CARE_PROVIDER_SITE_OTHER): Payer: 59 | Admitting: Family Medicine

## 2014-03-12 VITALS — BP 95/63 | HR 73 | Wt 141.0 lb

## 2014-03-12 DIAGNOSIS — M654 Radial styloid tenosynovitis [de Quervain]: Secondary | ICD-10-CM

## 2014-03-12 NOTE — Progress Notes (Signed)
CC: Cynthia GreavesHeather Alvarez is a 43 y.o. female is here for right thumb/wrist pain   Subjective: HPI:   Right wrist pain localized just proximal to the thumb radial aspect described only as a pain and soreness mild severity at rest, moderate with any movement of the thumb. Radiates proximally up the radius to the forearm. No interventions as of yet. Came on without any known trauma or overexertion. Has been present for 3 weeks slightly improving without any intervention. Denies swelling, redness, weakness, nor any overlying skin changes.  Review Of Systems Outlined In HPI  Past Medical History  Diagnosis Date  . 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 Wisdom tooth extraction     Family History  Problem Relation Age of Onset  . Hypertension Mother   . Heart attack Maternal Grandmother   . Heart attack Maternal Grandfather   . Stroke Maternal Grandfather   . Heart attack Paternal Grandmother   . Heart attack Paternal Grandfather     History   Social History  . Marital Status: Married    Spouse Name: N/A    Number of Children: N/A  . Years of Education: N/A   Occupational History  . Not on file.   Social History Main Topics  . Smoking status: Never Smoker   . Smokeless tobacco: Never Used  . Alcohol Use: No  . Drug Use: No  . Sexual Activity: Yes    Birth Control/ Protection: None   Other Topics Concern  . Not on file   Social History Narrative  . No narrative on file     Objective: BP 95/63  Pulse 73  Wt 141 lb (63.957 kg)  General: Alert and Oriented, No Acute Distress HEENT: Pupils equal, round, reactive to light. Conjunctivae clear. Moist membranes pharynx unremarkable Cardiac: Regular rate and rhythm. Normal S1/S2.  No murmurs, rubs, nor gallops.   Extremities: No peripheral edema.  Strong peripheral pulses. Full range of motion strength in the right wrist, no pain in anatomical snuff box. Pain  is 100% reproduced with Finkelstein's maneuver. No wrist pain with radial and ulnar compression. Mental Status: No depression, anxiety, nor agitation. Skin: Warm and dry. No overlying skin changes at site of discomfort  Assessment & Plan: Cynthia Alvarez was seen today for right thumb/wrist pain.  Diagnoses and associated orders for this visit:  De Quervain's tenosynovitis, right    She was fitted and placed in a wrist immobilizer with thumb immobilizer to be worn a daily basis the next 3 weeks. In 2 weeks I've encouraged her to start using rehabilitation exercises I provided her today. Use Aleve twice a day for pain control. Followup with Dr. Karie Schwalbe. in sports medicine in 3 weeks if not improved.  Return if symptoms worsen or fail to improve.

## 2014-10-04 ENCOUNTER — Encounter: Payer: Self-pay | Admitting: Family Medicine

## 2016-02-10 ENCOUNTER — Encounter: Payer: Self-pay | Admitting: Family Medicine

## 2016-02-10 ENCOUNTER — Ambulatory Visit (INDEPENDENT_AMBULATORY_CARE_PROVIDER_SITE_OTHER): Payer: BLUE CROSS/BLUE SHIELD | Admitting: Family Medicine

## 2016-02-10 VITALS — BP 109/71 | HR 90 | Wt 142.0 lb

## 2016-02-10 DIAGNOSIS — G5603 Carpal tunnel syndrome, bilateral upper limbs: Secondary | ICD-10-CM | POA: Diagnosis not present

## 2016-02-10 DIAGNOSIS — R413 Other amnesia: Secondary | ICD-10-CM | POA: Diagnosis not present

## 2016-02-10 LAB — COMPLETE METABOLIC PANEL WITH GFR
ALBUMIN: 4.5 g/dL (ref 3.6–5.1)
ALK PHOS: 39 U/L (ref 33–115)
ALT: 18 U/L (ref 6–29)
AST: 17 U/L (ref 10–30)
BILIRUBIN TOTAL: 0.7 mg/dL (ref 0.2–1.2)
BUN: 15 mg/dL (ref 7–25)
CO2: 22 mmol/L (ref 20–31)
CREATININE: 0.96 mg/dL (ref 0.50–1.10)
Calcium: 9.3 mg/dL (ref 8.6–10.2)
Chloride: 105 mmol/L (ref 98–110)
GFR, Est African American: 84 mL/min (ref >=60)
GFR, Est Non African American: 73 mL/min (ref >=60)
GLUCOSE: 92 mg/dL (ref 65–99)
Potassium: 4.4 mmol/L (ref 3.5–5.3)
SODIUM: 138 mmol/L (ref 135–146)
TOTAL PROTEIN: 6.9 g/dL (ref 6.1–8.1)

## 2016-02-10 LAB — VITAMIN B12: Vitamin B-12: 664 pg/mL (ref 200–1100)

## 2016-02-10 LAB — HEMOGLOBIN: HEMOGLOBIN: 13.1 g/dL (ref 12.0–15.0)

## 2016-02-10 LAB — TSH: TSH: 0.94 mIU/L

## 2016-02-10 NOTE — Progress Notes (Signed)
CC: Cynthia GreavesHeather Alvarez is a 45 y.o. female is here for Memory Loss; Carpal Tunnel; and Speech Problem   Subjective: HPI:  Bilateral wrist pain and numbness in the palm of both hands that occurs while sleeping and has experienced only for the first few minutes after waking up. Improves if she stretches the wrist. She denies swelling redness warmth or overlying skin changes at the site of discomfort. It's been going on for many months and occurs most nights out of the week. No interventions as of yet. She ascribes some weakness with opening jars. No other weakness. Denies any other motor or sensory disturbances other than some memory loss that she describes as difficulty with word finding and forgetfulness. She denies getting lost or any other cognitive problems. She denies long-term memory loss. She believes the memory issues have been going on for a few weeks now. No interventions as of yet currently mild in severity but more noticeable than weeks prior. Denies any coordination difficulty or balance problems.   Review Of Systems Outlined In HPI  Past Medical History  Diagnosis Date  . 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 Wisdom tooth extraction     Family History  Problem Relation Age of Onset  . Hypertension Mother   . Heart attack Maternal Grandmother   . Heart attack Maternal Grandfather   . Stroke Maternal Grandfather   . Heart attack Paternal Grandmother   . Heart attack Paternal Grandfather     Social History   Social History  . Marital Status: Married    Spouse Name: N/A  . Number of Children: N/A  . Years of Education: N/A   Occupational History  . Not on file.   Social History Main Topics  . Smoking status: Never Smoker   . Smokeless tobacco: Never Used  . Alcohol Use: No  . Drug Use: No  . Sexual Activity: Yes    Birth Control/ Protection: None   Other Topics Concern  . Not on file   Social  History Narrative     Objective: BP 109/71 mmHg  Pulse 90  Wt 142 lb (64.411 kg)  General: Alert and Oriented, No Acute Distress HEENT: Pupils equal, round, reactive to light. Conjunctivae clear.  External ears unremarkable, canals clear with intact TMs with appropriate landmarks.  Middle ear appears open without effusion. Pink inferior turbinates.  Moist mucous membranes, pharynx without inflammation nor lesions.  Neck supple without palpable lymphadenopathy nor abnormal masses. Lungs: Clear and comfortable work of breathing Cardiac: Regular rate and rhythm.  Extremities: No peripheral edema.  Strong peripheral pulses.  Phalens positive. Mental Status: No depression, anxiety, nor agitation. Skin: Warm and dry.  Assessment & Plan: Cynthia Alvarez was seen today for memory loss, carpal tunnel and speech problem.  Diagnoses and all orders for this visit:  Memory loss -     TSH -     Vitamin B12 -     Hemoglobin -     COMPLETE METABOLIC PANEL WITH GFR -     VITAMIN D 25 Hydroxy (Vit-D Deficiency, Fractures)  Bilateral carpal tunnel syndrome   Memory loss, rule out glycemia, lateral abnormality, anemia and vitamin deficiency above. Carpal tunnel syndrome: Provided with home rehabilitation exercises to engage in on a daily basis for at least the next 2 weeks. No Follow-up on file.

## 2016-02-11 LAB — VITAMIN D 25 HYDROXY (VIT D DEFICIENCY, FRACTURES): Vit D, 25-Hydroxy: 36 ng/mL (ref 30–100)

## 2016-02-13 ENCOUNTER — Telehealth: Payer: Self-pay | Admitting: Family Medicine

## 2016-02-13 MED ORDER — VITAMIN D (ERGOCALCIFEROL) 1.25 MG (50000 UNIT) PO CAPS: 50000.0000 [IU] | ORAL_CAPSULE | ORAL | Status: AC

## 2016-02-13 NOTE — Telephone Encounter (Signed)
Pt.notified

## 2016-02-13 NOTE — Telephone Encounter (Signed)
Pt notified of results; since labs are normal besides vit d being low nml, the patient wants to know what the next step is in re to her memory loss

## 2016-02-13 NOTE — Telephone Encounter (Signed)
Will you please let patient know that her thyroid function, vitamin B12, kidney function, liver function, and blood sugar were normal.  Her Vitamin D was at the lower limit of normal and I'd recommend she start on a weekly vitamin d supplement that I've sent [facility] [place], [place].

## 2016-02-13 NOTE — Telephone Encounter (Signed)
Trying Vitamin D for at least 1-2 months is the next step. Follow up in 1-2 months if still noticed by her or family members.

## 2021-04-28 ENCOUNTER — Other Ambulatory Visit: Payer: Self-pay

## 2021-04-28 ENCOUNTER — Emergency Department (INDEPENDENT_AMBULATORY_CARE_PROVIDER_SITE_OTHER): Payer: BC Managed Care – PPO

## 2021-04-28 ENCOUNTER — Emergency Department
Admission: EM | Admit: 2021-04-28 | Discharge: 2021-04-28 | Disposition: A | Discharge status: Home / Self Care | Payer: BC Managed Care – PPO | Source: Home / Self Care

## 2021-04-28 DIAGNOSIS — M25541 Pain in joints of right hand: Secondary | ICD-10-CM | POA: Diagnosis not present

## 2021-04-28 DIAGNOSIS — S62314A Displaced fracture of base of fourth metacarpal bone, right hand, initial encounter for closed fracture: Secondary | ICD-10-CM | POA: Diagnosis not present

## 2021-04-28 DIAGNOSIS — S62306B Unspecified fracture of fifth metacarpal bone, right hand, initial encounter for open fracture: Secondary | ICD-10-CM

## 2021-04-28 DIAGNOSIS — W19XXXA Unspecified fall, initial encounter: Secondary | ICD-10-CM | POA: Diagnosis not present

## 2021-04-28 DIAGNOSIS — M25441 Effusion, right hand: Secondary | ICD-10-CM | POA: Diagnosis not present

## 2021-04-28 MED ORDER — IBUPROFEN 800 MG PO TABS: 800.0000 mg | ORAL_TABLET | Freq: Three times a day (TID) | ORAL | 0 refills | Status: AC

## 2021-04-28 MED ORDER — HYDROCODONE-ACETAMINOPHEN 5-325 MG PO TABS: 1.0000 | ORAL_TABLET | Freq: Three times a day (TID) | ORAL | 0 refills | Status: AC

## 2021-04-28 NOTE — Discharge Instructions (Addendum)
Advised patient may use Ibuprofen 800 mg 2-3 times a day, may use pain medication every 8 hours, as needed for pain until meeting with orthopedic provider.  Staff was able to get patient scheduled with [facility] tonight (now) on [address] in [place], [place].

## 2021-04-28 NOTE — ED Provider Notes (Signed)
[facility]    CSN: [id number] Arrival date & time: 04/28/21  1803      History   Chief Complaint Chief Complaint  Patient presents with  . Hand Injury    RT    HPI Cynthia Alvarez is a 50 y.o. female.   HPI 50 year old female presents with right hand injury that occurred earlier today. Patient reports falling and jammed her right hand into a door frame.  Patient reports swelling and bruising with limited range of motion were noted immediately.  Past Medical History:  Diagnosis Date  . 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 Memory loss 02/10/2016  . Rosacea 01/25/2014  . 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 WISDOM TOOTH EXTRACTION      OB History    Gravida  5   Para  5   Term  4   Preterm  1   AB  0   Living  5     SAB  0   IAB  0   Ectopic  0   Multiple  0   Live Births  5            Home Medications    Prior to Admission medications   Medication Sig Start Date End Date Taking? Authorizing Provider  HYDROcodone-acetaminophen (NORCO) 5-325 MG tablet Take 1 tablet by mouth in the morning, at noon, and at bedtime for 5 days. 04/28/21 05/03/21 Yes Trevor Iha, FNP  ibuprofen (ADVIL) 800 MG tablet Take 1 tablet (800 mg total) by mouth 3 (three) times daily. 04/28/21  Yes Trevor Iha, FNP  Multiple Vitamins-Minerals (MULTIVITAMIN WITH MINERALS) tablet Take 1 tablet by mouth daily.      [provider]  Vitamin D, Ergocalciferol, (DRISDOL) 50000 units CAPS capsule Take 1 capsule (50,000 Units total) by mouth every 7 (seven) days. Recheck Vitamin D in 3 Months 02/13/16   Laren Boom, DO    Family History Family History  Problem Relation Age of Onset  . Hypertension Mother   . Heart attack Maternal Grandmother   . Heart attack Maternal Grandfather   . Stroke Maternal Grandfather    . Heart attack Paternal Grandmother   . Heart attack Paternal Grandfather     Social History Social History   Tobacco Use  . Smoking status: Never Smoker  . Smokeless tobacco: Never Used  Substance Use Topics  . Alcohol use: No  . Drug use: No     Allergies   Patient has no known allergies.   Review of Systems Review of Systems  Constitutional: Negative.   HENT: Negative.   Eyes: Negative.   Respiratory: Negative.   Cardiovascular: Negative.   Gastrointestinal: Negative.   Genitourinary: Negative.   Musculoskeletal:       Right hand pain  Skin: Negative.   Neurological: Negative.      Physical Exam Triage Vital Signs ED Triage Vitals [04/28/21 1830]  Enc Vitals Group     BP 131/70     Pulse Rate 72     Resp 18     Temp 97.9 F (36.6 C)     Temp Source Oral     SpO2 98 %     Weight      Height      Head Circumference      Peak Flow  Pain Score 5     Pain Loc      Pain Edu?      Excl. in GC?    No data found.  Updated Vital Signs BP 131/70 (BP Location: Left Arm)   Pulse 72   Temp 97.9 F (36.6 C) (Oral)   Resp 18   LMP 04/14/2021 (Approximate)   SpO2 98%      Physical Exam Vitals and nursing note reviewed.  Constitutional:      General: She is not in acute distress.    Appearance: Normal appearance. She is normal weight. She is not ill-appearing.  HENT:     Head: Normocephalic and atraumatic.     Mouth/Throat:     Mouth: Mucous membranes are moist.     Pharynx: Oropharynx is clear.  Eyes:     Conjunctiva/sclera: Conjunctivae normal.     Pupils: Pupils are equal, round, and reactive to light.  Cardiovascular:     Rate and Rhythm: Normal rate and regular rhythm.     Pulses: Normal pulses.     Heart sounds: Normal heart sounds.  Pulmonary:     Effort: Pulmonary effort is normal.     Breath sounds: Normal breath sounds. No wheezing, rhonchi or rales.  Musculoskeletal:     Cervical back: Normal range of motion and neck supple.      Comments: Right hand: TTP over dorsum of hand particularly over third fourth and fifth metacarpal head, soft tissue swelling noted, exam limited by pain.  Skin:    General: Skin is warm and dry.     Capillary Refill: Capillary refill takes less than 2 seconds.  Neurological:     General: No focal deficit present.     Mental Status: She is alert and oriented to person, place, and time.  Psychiatric:        Mood and Affect: Mood normal.        Behavior: Behavior normal.      UC Treatments / Results  Labs (all labs ordered are listed, but only abnormal results are displayed) Labs Reviewed - No data to display  EKG   Radiology DG Hand Complete Right  Result Date: 04/28/2021 CLINICAL DATA:  Pain and swelling base of fourth and fifth metacarpal, fell, swelling bruising EXAM: RIGHT HAND - COMPLETE 3+ VIEW COMPARISON:  None. FINDINGS: Frontal, oblique, and lateral views of the right hand are obtained. There are oblique displaced fractures of the fourth and fifth metacarpals, with moderate displacement and volar angulation at the fracture sites. No additional fractures. Joint spaces are well preserved. Mild soft tissue swelling ulnar aspect of the hand. IMPRESSION: 1. Oblique displaced fourth and fifth metacarpal diaphyseal fractures. Electronically Signed   By: Sharlet Salina M.D.   On: 04/28/2021 18:52    Procedures Procedures (including critical care time)  Medications Ordered in UC Medications - No data to display  Initial Impression / Assessment and Plan / UC Course  I have reviewed the triage vital signs and the nursing notes.  Pertinent labs & imaging results that were available during my care of the patient were reviewed by me and considered in my medical decision making (see chart for details).     MDM: 1.  Oblique displaced fourth and fifth metacarpal diaphyseal fractures.  Patient placed in a right hand Ace wrap (immobilizing fourth and fifth metacarpals) prior to  discharge.  Staff was able to get patient scheduled for [facility] this evening on [address] in [place], [place].  Patient discharged home, hemodynamically stable. Final Clinical Impressions(s) / UC Diagnoses   Final diagnoses:  Displaced fracture of base of fourth metacarpal bone, right hand, initial encounter for closed fracture  Open displaced fracture of fifth metacarpal bone of right hand, unspecified portion of metacarpal, initial encounter     Discharge Instructions     Advised patient may use Ibuprofen 800 mg 2-3 times a day, may use pain medication every 8 hours, as needed for pain until meeting with orthopedic provider.  Staff was able to get patient scheduled with [facility] tonight (now) on [address] in [place], [place].    ED Prescriptions    Medication Sig Dispense Auth. Provider   ibuprofen (ADVIL) 800 MG tablet Take 1 tablet (800 mg total) by mouth 3 (three) times daily. 30 tablet Trevor Iha, FNP   HYDROcodone-acetaminophen (NORCO) 5-325 MG tablet Take 1 tablet by mouth in the morning, at noon, and at bedtime for 5 days. 20 tablet Trevor Iha, FNP     I have reviewed the PDMP during this encounter.   Trevor Iha, FNP 04/28/21 2041

## 2021-04-28 NOTE — ED Triage Notes (Signed)
Pt c/o RT hand pain since ealier today when she fell and jammed her hand into the door frame. Swelling and bruising noted. Limited ROM. Pain 5/10 Ice applied.

## 2021-05-02 DIAGNOSIS — S62326A Displaced fracture of shaft of fifth metacarpal bone, right hand, initial encounter for closed fracture: Secondary | ICD-10-CM | POA: Diagnosis not present

## 2021-05-02 DIAGNOSIS — S62324A Displaced fracture of shaft of fourth metacarpal bone, right hand, initial encounter for closed fracture: Secondary | ICD-10-CM | POA: Diagnosis not present

## 2021-05-03 DIAGNOSIS — X58XXXA Exposure to other specified factors, initial encounter: Secondary | ICD-10-CM | POA: Diagnosis not present

## 2021-05-03 DIAGNOSIS — Y999 Unspecified external cause status: Secondary | ICD-10-CM | POA: Diagnosis not present

## 2021-05-03 DIAGNOSIS — S62326A Displaced fracture of shaft of fifth metacarpal bone, right hand, initial encounter for closed fracture: Secondary | ICD-10-CM | POA: Diagnosis not present

## 2021-05-03 DIAGNOSIS — S62324A Displaced fracture of shaft of fourth metacarpal bone, right hand, initial encounter for closed fracture: Secondary | ICD-10-CM | POA: Diagnosis not present

## 2021-05-09 DIAGNOSIS — M79644 Pain in right finger(s): Secondary | ICD-10-CM | POA: Diagnosis not present

## 2021-05-09 DIAGNOSIS — S62324A Displaced fracture of shaft of fourth metacarpal bone, right hand, initial encounter for closed fracture: Secondary | ICD-10-CM | POA: Diagnosis not present

## 2021-05-09 DIAGNOSIS — M25641 Stiffness of right hand, not elsewhere classified: Secondary | ICD-10-CM | POA: Diagnosis not present

## 2021-05-09 DIAGNOSIS — S62326A Displaced fracture of shaft of fifth metacarpal bone, right hand, initial encounter for closed fracture: Secondary | ICD-10-CM | POA: Diagnosis not present

## 2021-06-15 DIAGNOSIS — S62324D Displaced fracture of shaft of fourth metacarpal bone, right hand, subsequent encounter for fracture with routine healing: Secondary | ICD-10-CM | POA: Diagnosis not present

## 2021-06-15 DIAGNOSIS — S62326D Displaced fracture of shaft of fifth metacarpal bone, right hand, subsequent encounter for fracture with routine healing: Secondary | ICD-10-CM | POA: Diagnosis not present

## 2021-07-06 DIAGNOSIS — S62324D Displaced fracture of shaft of fourth metacarpal bone, right hand, subsequent encounter for fracture with routine healing: Secondary | ICD-10-CM | POA: Diagnosis not present

## 2021-07-06 DIAGNOSIS — S62326D Displaced fracture of shaft of fifth metacarpal bone, right hand, subsequent encounter for fracture with routine healing: Secondary | ICD-10-CM | POA: Diagnosis not present

## 2021-10-16 DIAGNOSIS — H93233 Hyperacusis, bilateral: Secondary | ICD-10-CM | POA: Diagnosis not present

## 2021-11-21 DIAGNOSIS — H93293 Other abnormal auditory perceptions, bilateral: Secondary | ICD-10-CM | POA: Diagnosis not present

## 2022-09-24 DIAGNOSIS — Z1231 Encounter for screening mammogram for malignant neoplasm of breast: Secondary | ICD-10-CM | POA: Diagnosis not present

## 2022-10-12 DIAGNOSIS — Z124 Encounter for screening for malignant neoplasm of cervix: Secondary | ICD-10-CM | POA: Diagnosis not present

## 2022-10-12 DIAGNOSIS — Z6828 Body mass index (BMI) 28.0-28.9, adult: Secondary | ICD-10-CM | POA: Diagnosis not present

## 2022-10-12 DIAGNOSIS — Z130 Encounter for screening for diseases of the blood and blood-forming organs and certain disorders involving the immune mechanism: Secondary | ICD-10-CM | POA: Diagnosis not present

## 2022-10-12 DIAGNOSIS — Z1151 Encounter for screening for human papillomavirus (HPV): Secondary | ICD-10-CM | POA: Diagnosis not present

## 2022-10-12 DIAGNOSIS — Z01419 Encounter for gynecological examination (general) (routine) without abnormal findings: Secondary | ICD-10-CM | POA: Diagnosis not present

## 2022-10-12 DIAGNOSIS — Z131 Encounter for screening for diabetes mellitus: Secondary | ICD-10-CM | POA: Diagnosis not present

## 2022-10-12 DIAGNOSIS — Z13228 Encounter for screening for other metabolic disorders: Secondary | ICD-10-CM | POA: Diagnosis not present

## 2022-10-12 DIAGNOSIS — Z1321 Encounter for screening for nutritional disorder: Secondary | ICD-10-CM | POA: Diagnosis not present

## 2022-10-12 DIAGNOSIS — Z13220 Encounter for screening for lipoid disorders: Secondary | ICD-10-CM | POA: Diagnosis not present

## 2022-10-16 DIAGNOSIS — N6310 Unspecified lump in the right breast, unspecified quadrant: Secondary | ICD-10-CM | POA: Diagnosis not present

## 2022-10-16 DIAGNOSIS — R928 Other abnormal and inconclusive findings on diagnostic imaging of breast: Secondary | ICD-10-CM | POA: Diagnosis not present

## 2022-10-16 DIAGNOSIS — R92321 Mammographic fibroglandular density, right breast: Secondary | ICD-10-CM | POA: Diagnosis not present

## 2022-10-16 DIAGNOSIS — R92322 Mammographic fibroglandular density, left breast: Secondary | ICD-10-CM | POA: Diagnosis not present

## 2022-10-22 IMAGING — DX DG HAND COMPLETE 3+V*R*
3 series · 3 of 3 positions shown · non-contrast
Comparison: None.

CLINICAL DATA: Pain and swelling base of fourth and fifth
metacarpal, fell, swelling bruising

EXAM:
RIGHT HAND - COMPLETE 3+ VIEW

[hand pa]
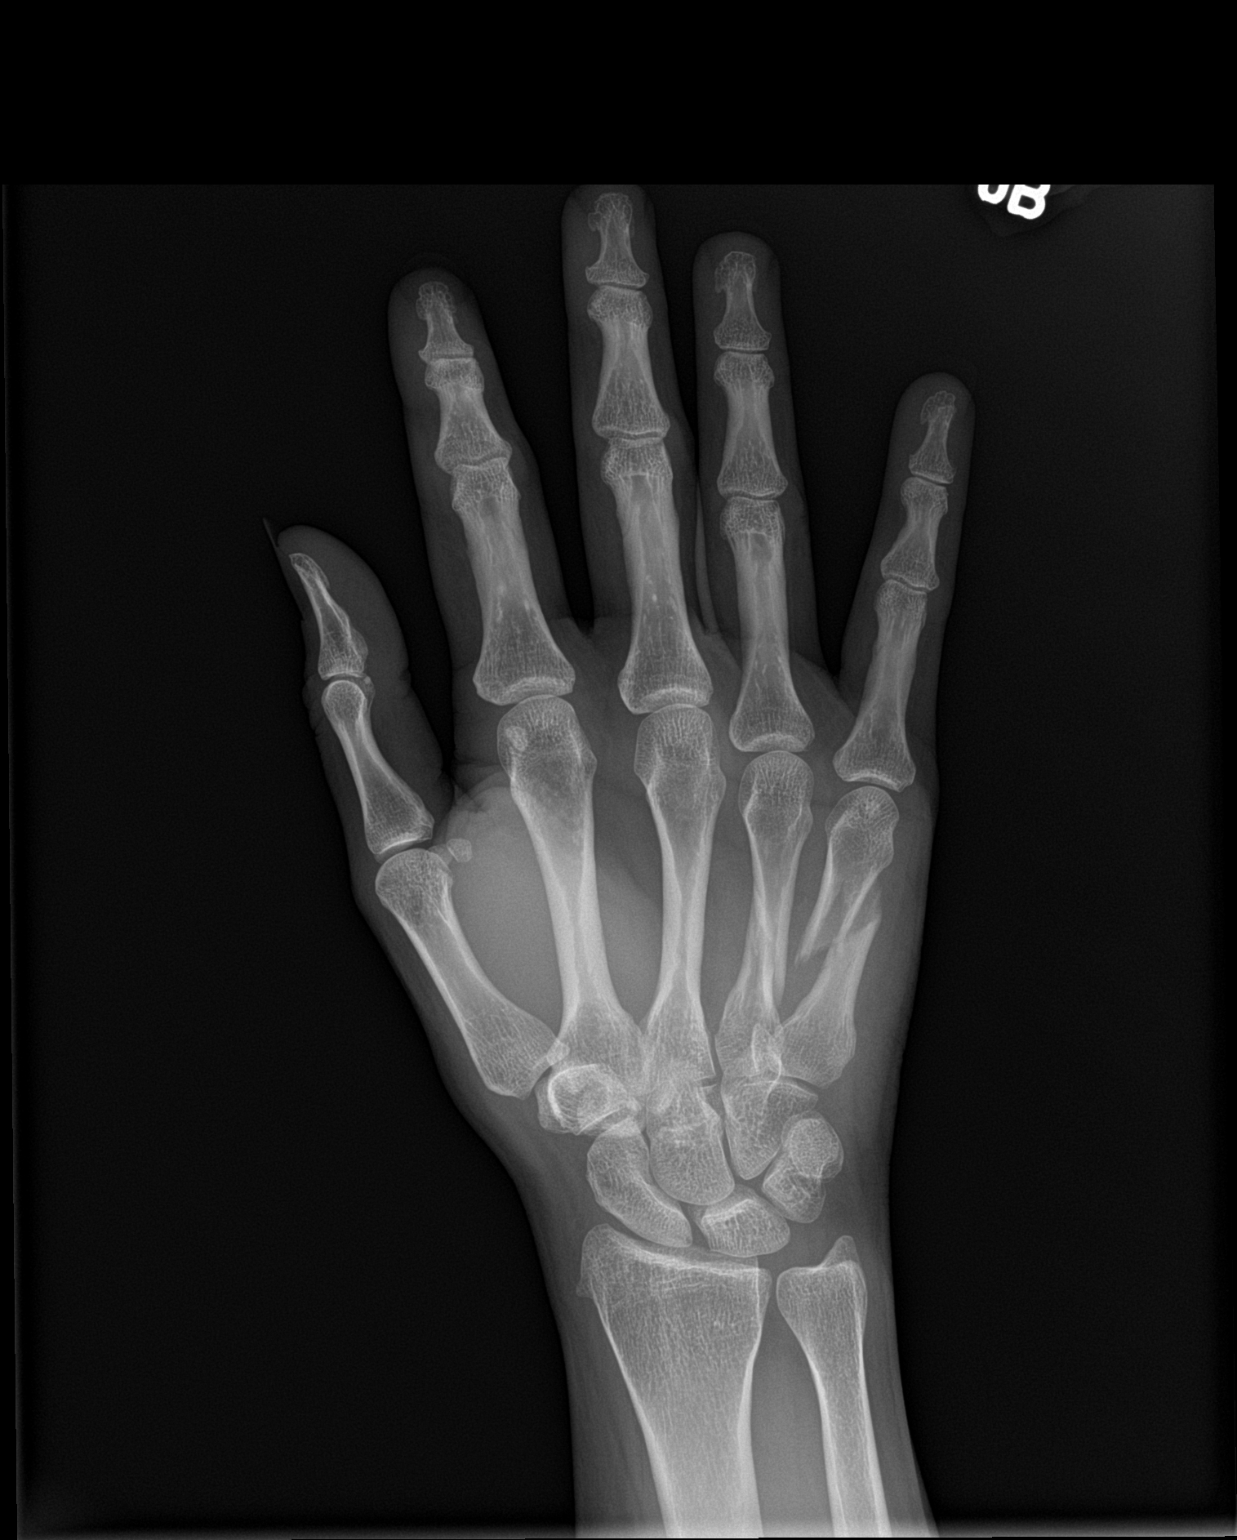

[hand obl]
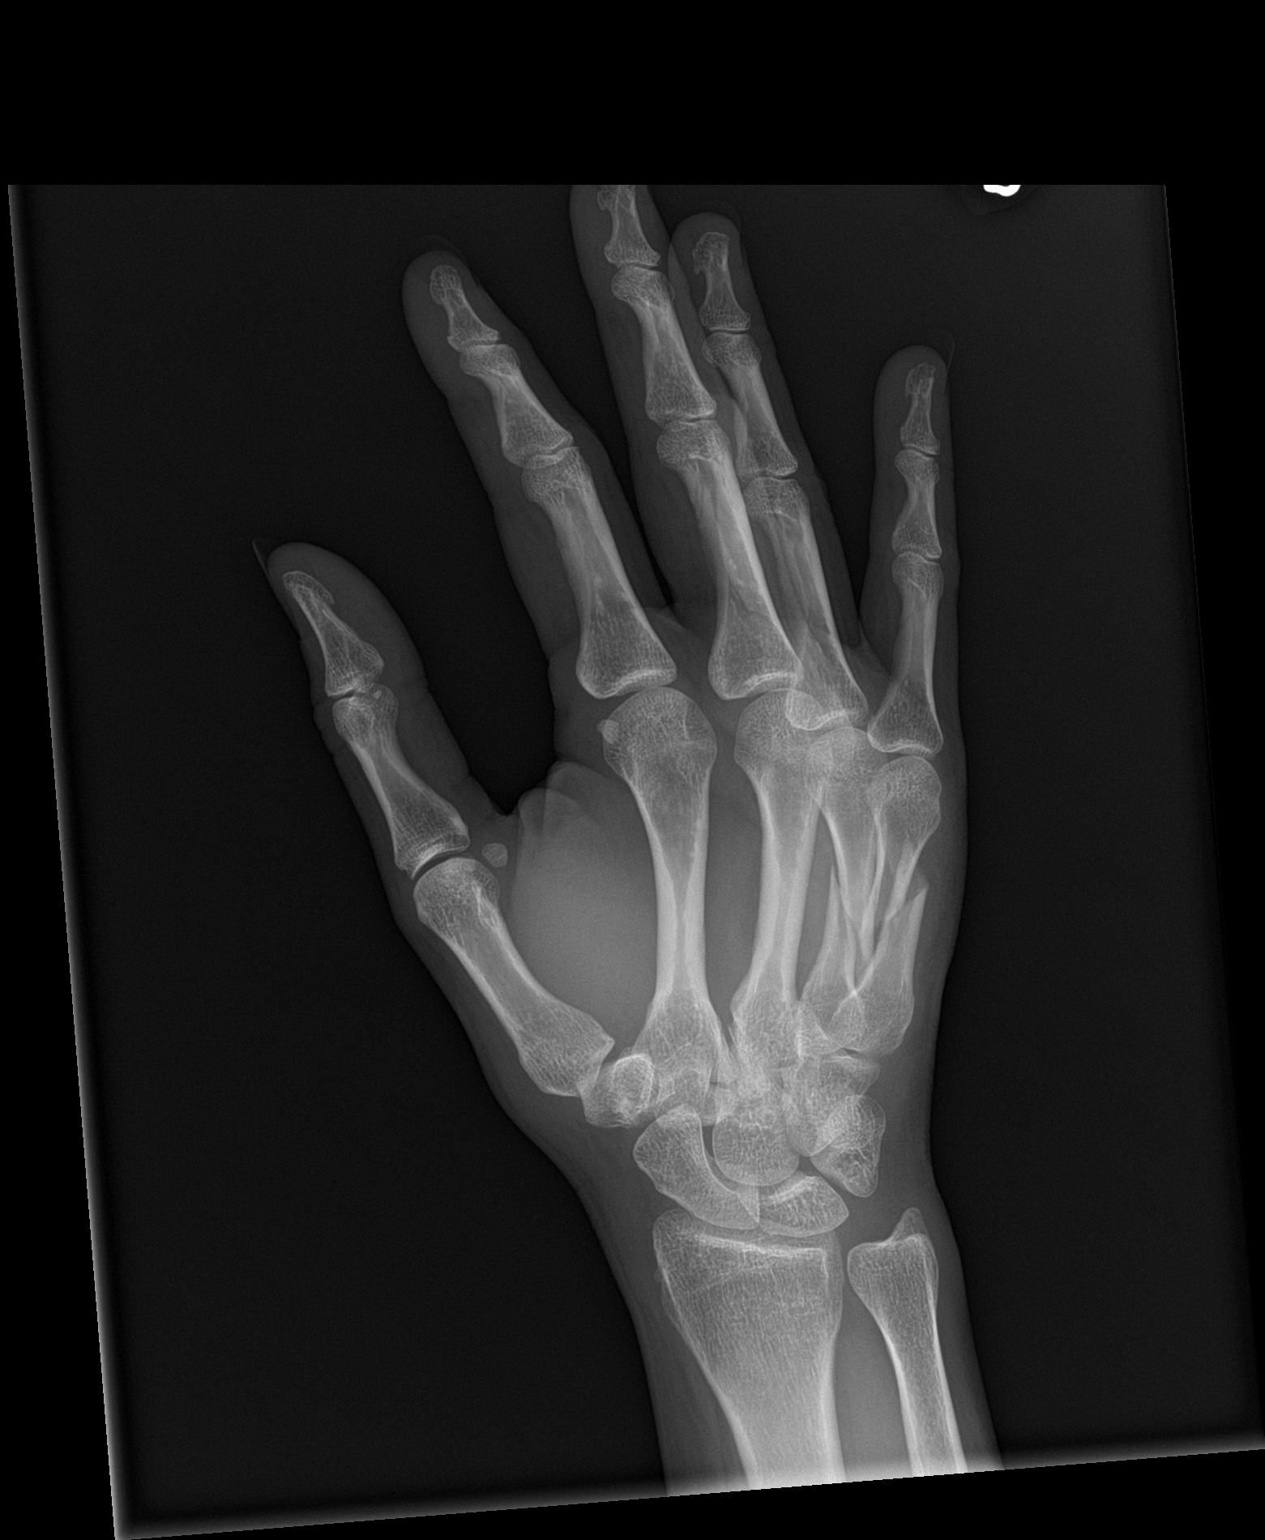

[hand lat]
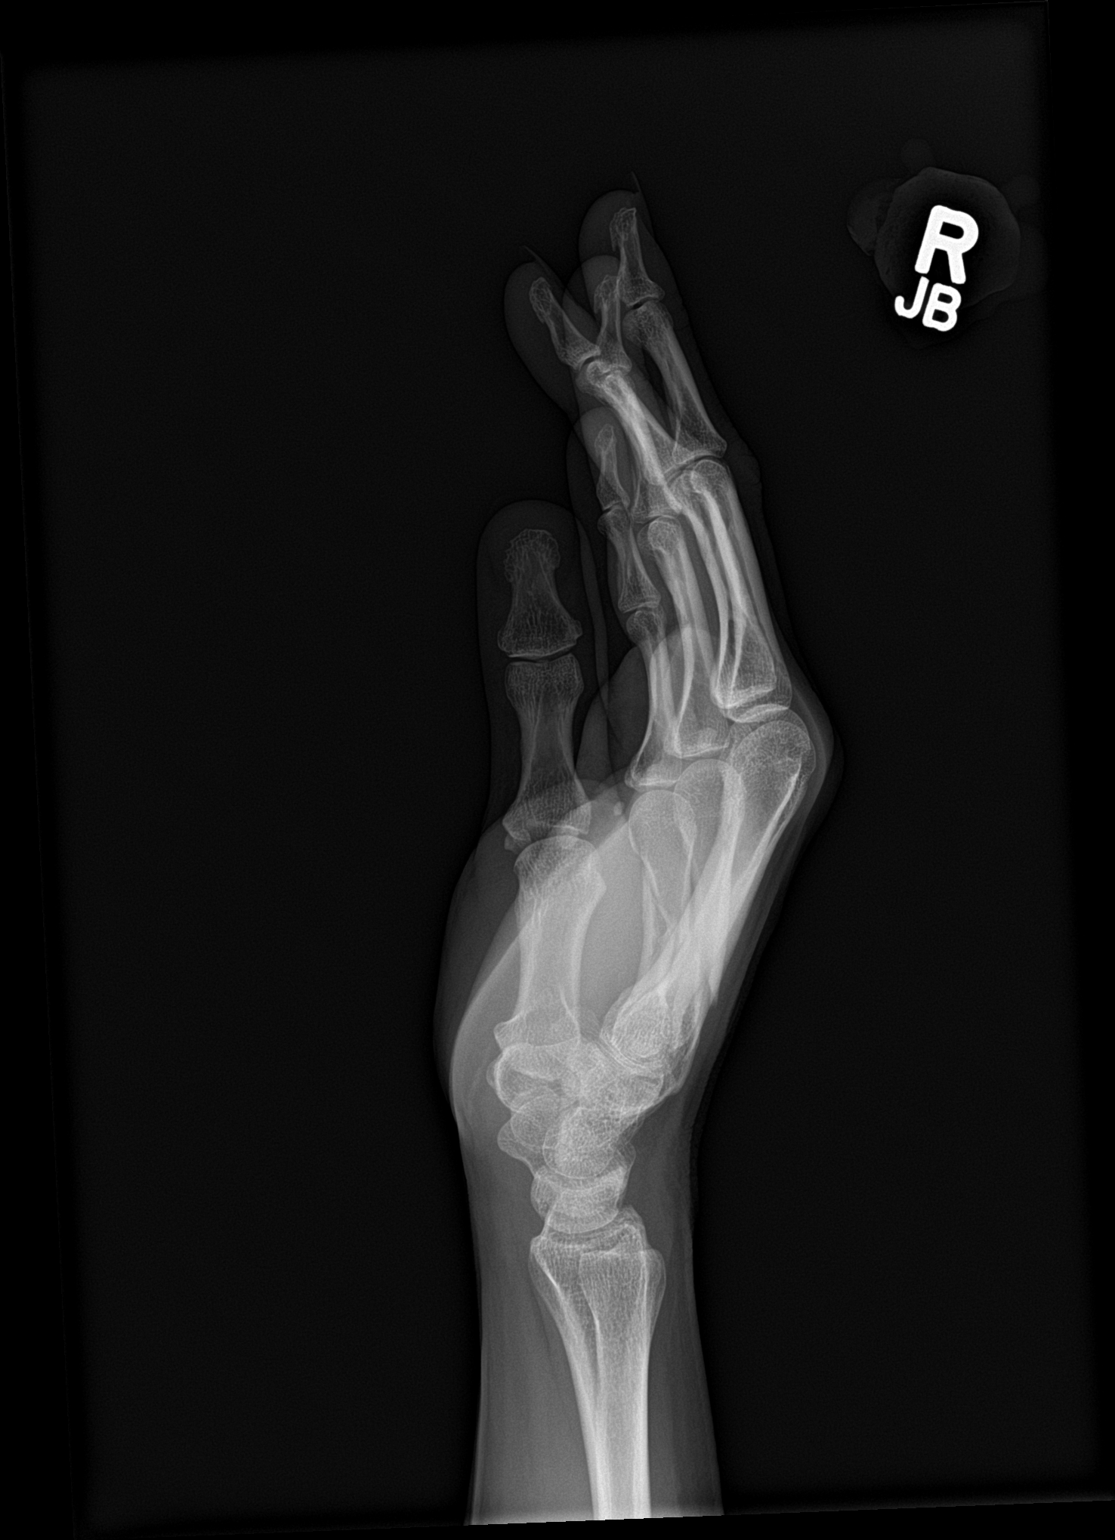

[3 of 3 positions shown; findings below may reference images not displayed]

FINDINGS: Frontal, oblique, and lateral views of the right hand are obtained.
There are oblique displaced fractures of the fourth and fifth
metacarpals, with moderate displacement and volar angulation at the
fracture sites. No additional fractures. Joint spaces are well
preserved. Mild soft tissue swelling ulnar aspect of the hand.
IMPRESSION: 1. Oblique displaced fourth and fifth metacarpal diaphyseal
fractures.

## 2022-11-01 DIAGNOSIS — Z1211 Encounter for screening for malignant neoplasm of colon: Secondary | ICD-10-CM | POA: Diagnosis not present

## 2022-11-06 DIAGNOSIS — N6313 Unspecified lump in the right breast, lower outer quadrant: Secondary | ICD-10-CM | POA: Diagnosis not present

## 2022-11-14 LAB — COLOGUARD: COLOGUARD: NEGATIVE

## 2022-11-15 DIAGNOSIS — Z23 Encounter for immunization: Secondary | ICD-10-CM | POA: Diagnosis not present

## 2022-11-15 DIAGNOSIS — Z0000 Encounter for general adult medical examination without abnormal findings: Secondary | ICD-10-CM | POA: Diagnosis not present

## 2022-11-15 DIAGNOSIS — E781 Pure hyperglyceridemia: Secondary | ICD-10-CM | POA: Diagnosis not present

## 2023-04-18 DIAGNOSIS — F321 Major depressive disorder, single episode, moderate: Secondary | ICD-10-CM | POA: Diagnosis not present

## 2023-04-18 DIAGNOSIS — F411 Generalized anxiety disorder: Secondary | ICD-10-CM | POA: Diagnosis not present

## 2023-05-02 DIAGNOSIS — F321 Major depressive disorder, single episode, moderate: Secondary | ICD-10-CM | POA: Diagnosis not present

## 2023-05-02 DIAGNOSIS — F411 Generalized anxiety disorder: Secondary | ICD-10-CM | POA: Diagnosis not present

## 2023-11-22 DIAGNOSIS — E559 Vitamin D deficiency, unspecified: Secondary | ICD-10-CM | POA: Diagnosis not present

## 2023-11-22 DIAGNOSIS — R7303 Prediabetes: Secondary | ICD-10-CM | POA: Diagnosis not present

## 2023-11-22 DIAGNOSIS — E781 Pure hyperglyceridemia: Secondary | ICD-10-CM | POA: Diagnosis not present

## 2023-11-22 DIAGNOSIS — Z0000 Encounter for general adult medical examination without abnormal findings: Secondary | ICD-10-CM | POA: Diagnosis not present

## 2023-12-30 DIAGNOSIS — M79641 Pain in right hand: Secondary | ICD-10-CM | POA: Diagnosis not present

## 2024-01-09 DIAGNOSIS — G43909 Migraine, unspecified, not intractable, without status migrainosus: Secondary | ICD-10-CM | POA: Diagnosis not present

## 2024-02-26 DIAGNOSIS — E781 Pure hyperglyceridemia: Secondary | ICD-10-CM | POA: Diagnosis not present

## 2024-08-26 NOTE — Progress Notes (Signed)
 Whisper test done for hearing

## 2024-09-17 ENCOUNTER — Emergency Department (HOSPITAL_COMMUNITY)

## 2024-09-17 ENCOUNTER — Observation Stay (HOSPITAL_COMMUNITY)
Admission: EM | Admit: 2024-09-17 | Discharge: 2024-09-18 | Disposition: A | Discharge status: Home / Self Care | Attending: Hospitalist | Admitting: Hospitalist

## 2024-09-17 ENCOUNTER — Other Ambulatory Visit: Payer: Self-pay

## 2024-09-17 ENCOUNTER — Encounter (HOSPITAL_COMMUNITY): Payer: Self-pay

## 2024-09-17 DIAGNOSIS — R29818 Other symptoms and signs involving the nervous system: Secondary | ICD-10-CM | POA: Diagnosis not present

## 2024-09-17 DIAGNOSIS — E876 Hypokalemia: Secondary | ICD-10-CM | POA: Insufficient documentation

## 2024-09-17 DIAGNOSIS — R471 Dysarthria and anarthria: Principal | ICD-10-CM | POA: Diagnosis present

## 2024-09-17 DIAGNOSIS — R410 Disorientation, unspecified: Secondary | ICD-10-CM | POA: Diagnosis not present

## 2024-09-17 DIAGNOSIS — R4701 Aphasia: Secondary | ICD-10-CM | POA: Diagnosis not present

## 2024-09-17 DIAGNOSIS — R9431 Abnormal electrocardiogram [ECG] [EKG]: Secondary | ICD-10-CM | POA: Diagnosis not present

## 2024-09-17 DIAGNOSIS — R531 Weakness: Secondary | ICD-10-CM | POA: Diagnosis not present

## 2024-09-17 DIAGNOSIS — R479 Unspecified speech disturbances: Principal | ICD-10-CM

## 2024-09-17 LAB — COMPREHENSIVE METABOLIC PANEL WITH GFR
ALT: 17 U/L (ref 0–44)
AST: 24 U/L (ref 15–41)
Albumin: 4.8 g/dL (ref 3.5–5.0)
Alkaline Phosphatase: 71 U/L (ref 38–126)
Anion gap: 13 (ref 5–15)
BUN: 15 mg/dL (ref 6–20)
CO2: 26 mmol/L (ref 22–32)
Calcium: 9.6 mg/dL (ref 8.9–10.3)
Chloride: 103 mmol/L (ref 98–111)
Creatinine, Ser: 1.17 mg/dL — ABNORMAL HIGH (ref 0.44–1.00)
GFR, Estimated: 56 mL/min — ABNORMAL LOW (ref >60)
Glucose, Bld: 111 mg/dL — ABNORMAL HIGH (ref 70–99)
Potassium: 3.1 mmol/L — ABNORMAL LOW (ref 3.5–5.1)
Sodium: 142 mmol/L (ref 135–145)
Total Bilirubin: 0.3 mg/dL (ref 0.0–1.2)
Total Protein: 7.5 g/dL (ref 6.5–8.1)

## 2024-09-17 LAB — URINE DRUG SCREEN
Amphetamines: NEGATIVE
Barbiturates: NEGATIVE
Benzodiazepines: NEGATIVE
Cocaine: NEGATIVE
Fentanyl: NEGATIVE
Methadone Scn, Ur: NEGATIVE
Opiates: NEGATIVE
Tetrahydrocannabinol: NEGATIVE

## 2024-09-17 LAB — CBC
HCT: 41.1 % (ref 36.0–46.0)
Hemoglobin: 13.7 g/dL (ref 12.0–15.0)
MCH: 29.9 pg (ref 26.0–34.0)
MCHC: 33.3 g/dL (ref 30.0–36.0)
MCV: 89.7 fL (ref 80.0–100.0)
Platelets: 372 K/uL (ref 150–400)
RBC: 4.58 MIL/uL (ref 3.87–5.11)
RDW: 13.5 % (ref 11.5–15.5)
WBC: 11.8 K/uL — ABNORMAL HIGH (ref 4.0–10.5)
nRBC: 0 % (ref 0.0–0.2)

## 2024-09-17 LAB — DIFFERENTIAL
Abs Immature Granulocytes: 0.03 K/uL (ref 0.00–0.07)
Basophils Absolute: 0 K/uL (ref 0.0–0.1)
Basophils Relative: 0 %
Eosinophils Absolute: 0.3 K/uL (ref 0.0–0.5)
Eosinophils Relative: 3 %
Immature Granulocytes: 0 %
Lymphocytes Relative: 32 %
Lymphs Abs: 3.8 K/uL (ref 0.7–4.0)
Monocytes Absolute: 1.1 K/uL — ABNORMAL HIGH (ref 0.1–1.0)
Monocytes Relative: 10 %
Neutro Abs: 6.5 K/uL (ref 1.7–7.7)
Neutrophils Relative %: 55 %

## 2024-09-17 LAB — PROTIME-INR
INR: 1 (ref 0.8–1.2)
Prothrombin Time: 13.5 s (ref 11.4–15.2)

## 2024-09-17 LAB — ETHANOL: Alcohol, Ethyl (B): <15 mg/dL (ref <15)

## 2024-09-17 LAB — APTT: aPTT: 30 s (ref 24–36)

## 2024-09-17 MED ORDER — ACETAMINOPHEN 160 MG/5ML PO SOLN: 650.0000 mg | ORAL | Status: DC | PRN

## 2024-09-17 MED ORDER — POTASSIUM CHLORIDE CRYS ER 20 MEQ PO TBCR
40.0000 meq | EXTENDED_RELEASE_TABLET | Freq: Once | ORAL | Status: AC
  Administered 2024-09-18: 40 meq via ORAL
  Filled 2024-09-17: qty 2

## 2024-09-17 MED ORDER — ACETAMINOPHEN 650 MG RE SUPP: 650.0000 mg | RECTAL | Status: DC | PRN

## 2024-09-17 MED ORDER — ACETAMINOPHEN 325 MG PO TABS: 650.0000 mg | ORAL_TABLET | ORAL | Status: DC | PRN

## 2024-09-17 MED ORDER — STROKE: EARLY STAGES OF RECOVERY BOOK
Freq: Once | Status: AC
  Filled 2024-09-17: qty 1

## 2024-09-17 MED ORDER — IOHEXOL 350 MG/ML SOLN: 75.0000 mL | Freq: Once | INTRAVENOUS | Status: DC | PRN

## 2024-09-17 MED ORDER — SODIUM CHLORIDE 0.9 % IV SOLN: INTRAVENOUS | Status: DC

## 2024-09-17 MED ORDER — IOHEXOL 350 MG/ML SOLN
75.0000 mL | Freq: Once | INTRAVENOUS | Status: AC | PRN
  Administered 2024-09-17: 75 mL via INTRAVENOUS

## 2024-09-17 NOTE — Consult Note (Signed)
 [facility] Consult Services   Patient Name:   Cynthia Alvarez, Cynthia Alvarez Date of Birth:   Jul 04, 1971 Identification Number:   MRN - [id number] Date of Service:   09/17/2024 21:09:30  Diagnosis:       R47.9 - Transient Speech Difficulties  Impression:      53 yo F who presents with difficulty speaking, now improving. NIHSS 1 for mild dysarthria and intermittent stuttering. On exam she is able to speak and communicate in full sentences, follows all commands, name objects, read sentences, with no elements of aphasia noted. Her exam is nonfocal with no other deficits noted including no weakness. She is not a candidate for thrombolytic due to rapidly resolving symptoms. Risk of hemorrhage likely outweighs potential benefit. This was discussed with the patient who voiced understanding.    CTH and CTA Head and Neck are negative for acute hemorrhage or LVO.    Presentation could be due to complex migraine vs TIA/minor stroke vs functional neurologic disorder (conversion disorder). Recommend admission for stroke rule out with MRI Brain w/o contrast.  Our recommendations are outlined below.  Recommendations:        Stroke/Telemetry Floor       Neuro Checks (Q4)       Bedside Swallow Eval       DVT Prophylaxis       IV Fluids, Normal Saline       Head of Bed 30 Degrees       Euglycemia and Avoid Hyperthermia (PRN Acetaminophen )       MRI Brain w/o contrast  Sign Out:       Discussed with Emergency Department Provider    ------------------------------------------------------------------------------  Advanced Imaging: CTA Head and Neck Completed.  LVO:No  Patient is not a candidate for NIR   Metrics: Last Known Well: 09/17/2024 20:30:00 Dispatch Time: 09/17/2024 78:90:70 Arrival Time: 09/17/2024 21:15:00 Initial Response Time: 09/17/2024 21:21:12 Symptoms: difficulty speaking. Initial patient interaction: 09/17/2024 21:24:56 NIHSS Assessment Completed:  09/17/2024 21:34:42 Patient is not a candidate for Thrombolytic. Thrombolytic Medical Decision: 09/17/2024 21:34:43 Patient was not deemed candidate for Thrombolytic because of following reasons: Stroke severity too mild (non-disabling) .  CT Head: I personally reviewed all the CT images that were available to me and it showed: no acute hemorrhage or acute territorial infarct  Primary Provider Notified of Diagnostic Impression and Management Plan on: 09/17/2024 21:38:55    ------------------------------------------------------------------------------  History of Present Illness: Patient is a 53 year old Female.  Patient was brought by EMS for symptoms of difficulty speaking. Patient was LKW at around 2030 tonight and then started to have difficulty speaking. EMS noted no weakness or other focal deficits on her assessment. Patient is able to communicate with me now and reports her speech is improving at this time. She states she had a headache earlier today at around 5am but this has now improved and she denies any pain currently. She denies any weakness or numbness, or vision changes.  Denies experiencing symptoms like this previously. She is tearful, states that this situation is scary for her.     Medications:  No Anticoagulant use  No Antiplatelet use Reviewed EMR for current medications  Allergies:  Reviewed  Social History: Smoking: No  Family History:  There is no family history of premature cerebrovascular disease pertinent to this consultation  ROS : 14 Points Review of Systems was performed and was negative except mentioned in HPI.  Past Surgical History: There Is No Surgical History Contributory To Today's Visit  Examination: BP(192/92), Pulse(90), Blood Glucose(110) 1A: Level of Consciousness - Alert; keenly responsive + 0 1B: Ask Month and Age - Both Questions Right + 0 1C: Blink Eyes & Squeeze Hands - Performs Both Tasks + 0 2: Test Horizontal  Extraocular Movements - Normal + 0 3: Test Visual Fields - No Visual Loss + 0 4: Test Facial Palsy (Use Grimace if Obtunded) - Normal symmetry + 0 5A: Test Left Arm Motor Drift - No Drift for 10 Seconds + 0 5B: Test Right Arm Motor Drift - No Drift for 10 Seconds + 0 6A: Test Left Leg Motor Drift - No Drift for 5 Seconds + 0 6B: Test Right Leg Motor Drift - No Drift for 5 Seconds + 0 7: Test Limb Ataxia (FNF/Heel-Shin) - No Ataxia + 0 8: Test Sensation - Normal; No sensory loss + 0 9: Test Language/Aphasia - Normal; No aphasia + 0 10: Test Dysarthria - Mild-Moderate Dysarthria: Slurring but can be understood + 1 11: Test Extinction/Inattention - No abnormality + 0  NIHSS Score: 1  NIHSS Free Text : stuttering speech with some mild dysarthria, improving rapidly; patient is able to name objects, read sentences, describe the picture, and follow commands without difficulty. No aphasia  some mild tremors with finger to nose testing but patient is able to perform without ataxia or dysmetria  Pre-Morbid Modified Rankin Scale: 0 Points = No symptoms at all  Spoke with : Dr. Towana I reviewed the available imaging via [facility] and initiated discussion with the primary provider  This consult was conducted in real time using interactive audio and video technology. Patient was informed of the technology being used for this visit and agreed to proceed. Patient located in hospital and provider located at home/office setting.   Patient is being evaluated for possible acute neurologic impairment and high probability of imminent or life-threatening deterioration. I spent total of 35 minutes providing care to this patient, including time for face to face visit via telemedicine, review of medical records, imaging studies and discussion of findings with providers, the patient and/or family.    Dr Venus Queen   [facility] For Inpatient follow-up with [facility] physician please call [facility] at  [phone]. As we are not an outpatient service for any post hospital discharge needs please contact the hospital for assistance. If you have any questions for the [facility] physicians or need to reconsult for clinical or diagnostic changes please contact us  via [facility] at [phone].   Signature : Venus Queen

## 2024-09-17 NOTE — ED Notes (Signed)
 CT notified at 2109

## 2024-09-17 NOTE — ED Triage Notes (Signed)
 Pt BIB [facility] for sudden asphasia. LKW 2050 tonight. Pt was sitting on the couch with family and she tried to speak, but no words would come out. Pt slurring words, but no other symptoms  CBG 110

## 2024-09-17 NOTE — ED Notes (Signed)
 Stroke paged 2106

## 2024-09-17 NOTE — ED Notes (Signed)
 EDP called at 2112

## 2024-09-17 NOTE — ED Provider Notes (Signed)
  EMERGENCY DEPARTMENT AT [facility] Provider Note   CSN: [id number] Arrival date & time: 09/17/24  2115     Patient presents with: No chief complaint on file.   Cynthia Alvarez is a 53 y.o. female.  She is brought in by EMS for sudden onset of difficulty speaking.  Symptoms started about 30 minutes prior to arrival.  She said she was just sitting on the couch when she noticed she was having problems and had to get her husband.  She said she had a headache earlier today but none now.  No numbness or weakness.  She knows the words in her head but she is having difficulty getting them out.  She has never had this before.  Denies any significant medical history no drugs or alcohol.  No blurry vision double vision.   The history is provided by the patient and the EMS personnel.  Cerebrovascular Accident This is a new problem. The current episode started less than 1 hour ago. The problem occurs constantly. The problem has not changed since onset.Pertinent negatives include no chest pain, no abdominal pain, no headaches and no shortness of breath. Exacerbated by: speaking. Nothing relieves the symptoms. She has tried nothing for the symptoms. The treatment provided no relief.       Prior to Admission medications   Medication Sig Start Date End Date Taking? Authorizing Provider  ibuprofen  (ADVIL ) 800 MG tablet Take 1 tablet (800 mg total) by mouth 3 (three) times daily. 04/28/21   Ragan, Sol Englert, FNP  Multiple Vitamins-Minerals (MULTIVITAMIN WITH MINERALS) tablet Take 1 tablet by mouth daily.      [provider]  Vitamin D , Ergocalciferol , (DRISDOL ) 50000 units CAPS capsule Take 1 capsule (50,000 Units total) by mouth every 7 (seven) days. Recheck Vitamin D  in 3 Months 02/13/16   Hessie Mallick, DO    Allergies: Patient has no known allergies.    Review of Systems  Constitutional:  Negative for fever.  Eyes:  Negative for visual disturbance.  Respiratory:  Negative  for shortness of breath.   Cardiovascular:  Negative for chest pain.  Gastrointestinal:  Negative for abdominal pain.  Neurological:  Positive for speech difficulty. Negative for facial asymmetry, weakness, numbness and headaches.    Updated Vital Signs BP 133/82 (BP Location: Left Arm)   Pulse 64   Temp 97.8 F (36.6 C) (Oral)   Resp 18   Ht 5' 6 (1.676 m)   Wt 77.1 kg   SpO2 99%   BMI 27.43 kg/m   Physical Exam Vitals and nursing note reviewed.  Constitutional:      General: She is not in acute distress.    Appearance: She is well-developed.  HENT:     Head: Normocephalic and atraumatic.  Eyes:     Conjunctiva/sclera: Conjunctivae normal.  Cardiovascular:     Rate and Rhythm: Normal rate and regular rhythm.     Heart sounds: No murmur heard. Pulmonary:     Effort: Pulmonary effort is normal. No respiratory distress.     Breath sounds: Normal breath sounds.  Abdominal:     Palpations: Abdomen is soft.     Tenderness: There is no abdominal tenderness. There is no guarding or rebound.  Musculoskeletal:        General: No deformity.     Cervical back: Neck supple.  Skin:    General: Skin is warm and dry.     Capillary Refill: Capillary refill takes less than 2 seconds.  Neurological:  Mental Status: She is alert.     Cranial Nerves: Cranial nerve deficit present.     Sensory: No sensory deficit.     Motor: No weakness.     Comments: She is awake and alert.  No gross facial asymmetry.  She is having some slow and stuttering speech.  She says this is not her baseline.  Her upper and lower extremity strength and sensation are preserved.  She is having some shaking/tremor of her head and her left arm.     (all labs ordered are listed, but only abnormal results are displayed) Labs Reviewed  CBC - Abnormal; Notable for the following components:      Result Value   WBC 11.8 (*)    All other components within normal limits  DIFFERENTIAL - Abnormal; Notable for the  following components:   Monocytes Absolute 1.1 (*)    All other components within normal limits  COMPREHENSIVE METABOLIC PANEL WITH GFR - Abnormal; Notable for the following components:   Potassium 3.1 (*)    Glucose, Bld 111 (*)    Creatinine, Ser 1.17 (*)    GFR, Estimated 56 (*)    All other components within normal limits  LIPID PANEL - Abnormal; Notable for the following components:   Triglycerides 155 (*)    All other components within normal limits  COMPREHENSIVE METABOLIC PANEL WITH GFR - Abnormal; Notable for the following components:   Glucose, Bld 100 (*)    Total Protein 6.4 (*)    All other components within normal limits  MAGNESIUM - Abnormal; Notable for the following components:   Magnesium 2.6 (*)    All other components within normal limits  PROTIME-INR  APTT  ETHANOL  URINE DRUG SCREEN  HIV ANTIBODY (ROUTINE TESTING W REFLEX)  CBC  PHOSPHORUS  I-STAT CHEM 8, ED    EKG: None EKG not crossing in epic.  Normal sinus rhythm no acute ST-T's.  Radiology: MR BRAIN WO CONTRAST Result Date: 09/18/2024 EXAM: MRI BRAIN WITHOUT CONTRAST 09/18/2024 08:11:00 AM TECHNIQUE: Multiplanar multisequence MRI of the head/brain was performed without the administration of intravenous contrast. COMPARISON: CT head and CTA head and neck 09/17/2024. CLINICAL HISTORY: 53 year old female. Neuro deficit, acute, stroke suspected. FINDINGS: BRAIN AND VENTRICLES: Normal flow voids. No acute infarct. No intracranial hemorrhage. No mass. No midline shift. No hydrocephalus. Mildly asymmetry of the lateral ventricles appears to be normal variation. Normal brain volume for age. No ventriculomegaly. Normal for age gray and white matter signal. No encephalomalacia or chronic cerebral blood products identified. The sella is unremarkable. ORBITS: No acute abnormality. SINUSES AND MASTOIDS: No acute abnormality. BONES AND SOFT TISSUES: Normal marrow signal. No acute soft tissue abnormality. IMPRESSION: 1.  Normal for age non-contrast MRI appearance of the brain. Electronically signed by: Helayne Hurst MD 09/18/2024 08:17 AM EDT RP Workstation: [id number]   CT ANGIO HEAD NECK W WO CM (CODE STROKE) Result Date: 09/17/2024 EXAM: CTA HEAD AND NECK WITH AND WITHOUT 09/17/2024 09:56:38 PM TECHNIQUE: CTA of the head and neck was performed with and without the administration of 75 mL of iohexol (OMNIPAQUE) 350 MG/ML injection. Multiplanar 2D and/or 3D reformatted images are provided for review. Automated exposure control, iterative reconstruction, and/or weight based adjustment of the mA/kV was utilized to reduce the radiation dose to as low as reasonably achievable. Stenosis of the internal carotid arteries measured using NASCET criteria. COMPARISON: CT from earlier the same day. CLINICAL HISTORY: Neuro deficit, acute, stroke suspected. Weakness and confusion. Perfusion will  be done as soon as 18g iv started. FINDINGS: CTA NECK: AORTIC ARCH AND ARCH VESSELS: No dissection or arterial injury. No significant stenosis of the brachiocephalic or subclavian arteries. CERVICAL CAROTID ARTERIES: No dissection, arterial injury, or hemodynamically significant stenosis by NASCET criteria. CERVICAL VERTEBRAL ARTERIES: No dissection, arterial injury, or significant stenosis. LUNGS AND MEDIASTINUM: Unremarkable. SOFT TISSUES: No acute abnormality. BONES: No acute abnormality. CTA HEAD: ANTERIOR CIRCULATION: No significant stenosis of the internal carotid arteries. No significant stenosis of the anterior cerebral arteries. No significant stenosis of the middle cerebral arteries. No aneurysm. POSTERIOR CIRCULATION: No significant stenosis of the posterior cerebral arteries. No significant stenosis of the basilar artery. No significant stenosis of the vertebral arteries. No aneurysm. OTHER: No dural venous sinus thrombosis on this non-dedicated study. IMPRESSION: 1. Normal CTA of the head and neck. No large vessel occlusion or other  emergent finding. Electronically signed by: Morene Hoard MD 09/17/2024 10:13 PM EDT RP Workstation: [id number]   CT HEAD CODE STROKE WO CONTRAST (LKW 0-4.5h, LVO 0-24h) Result Date: 09/17/2024 EXAM: CT HEAD WITHOUT CONTRAST 09/17/2024 09:24:49 PM TECHNIQUE: CT of the head was performed without the administration of intravenous contrast. Automated exposure control, iterative reconstruction, and/or weight based adjustment of the mA/kV was utilized to reduce the radiation dose to as low as reasonably achievable. COMPARISON: None available. CLINICAL HISTORY: Neuro deficit, acute, stroke suspected. CODE STROKE DR Maretta Overdorf. [phone] SUDDEN ONSET WEAKNESS. FINDINGS: BRAIN AND VENTRICLES: No acute hemorrhage. No evidence of acute infarct. No hydrocephalus. No extra-axial collection. No mass effect or midline shift. ORBITS: No acute abnormality. SINUSES: No acute abnormality. SOFT TISSUES AND SKULL: No acute soft tissue abnormality. No skull fracture. IMPRESSION: 1. No acute intracranial abnormality. Aspects  is 10. 2. Findings communicated by telephone  to Dr. Towana at 9:32 pm on 09/17/2024. Electronically signed by: Morene Hoard MD 09/17/2024 09:33 PM EDT RP Workstation: [id number]     .Critical Care  Performed by: Towana Ozell BROCKS, MD Authorized by: Towana Ozell BROCKS, MD   Critical care provider statement:    Critical care time (minutes):  45   Critical care time was exclusive of:  Separately billable procedures and treating other patients   Critical care was necessary to treat or prevent imminent or life-threatening deterioration of the following conditions:  CNS failure or compromise   Critical care was time spent personally by me on the following activities:  Development of treatment plan with patient or surrogate, discussions with consultants, evaluation of patient's response to treatment, examination of patient, obtaining history from patient or surrogate, ordering and performing  treatments and interventions, ordering and review of laboratory studies, ordering and review of radiographic studies, pulse oximetry, re-evaluation of patient's condition and review of old charts   I assumed direction of critical care for this patient from another provider in my specialty: no      Medications Ordered in the ED  iohexol (OMNIPAQUE) 350 MG/ML injection 75 mL (has no administration in time range)  0.9 %  sodium chloride infusion ( Intravenous Restarted 09/18/24 9177)  acetaminophen  (TYLENOL ) tablet 650 mg (has no administration in time range)    Or  acetaminophen  (TYLENOL ) 160 MG/5ML solution 650 mg (has no administration in time range)    Or  acetaminophen  (TYLENOL ) suppository 650 mg (has no administration in time range)  iohexol (OMNIPAQUE) 350 MG/ML injection 75 mL (75 mLs Intravenous Contrast Given 09/17/24 2156)   stroke: early stages of recovery book ( Does not apply Given 09/18/24 0823)  potassium chloride SA (KLOR-CON M) CR tablet 40 mEq (40 mEq Oral Given 09/18/24 0057)    Clinical Course as of 09/18/24 1013  Thu Sep 17, 2024  2122 Code stroke activated. [MB]  2133 Received call from radiology that the initial head CT is negative. [MB]  2143 Reviewed case with neurology on-call.  He said he is giving her an NIH of 1 for the stuttering speech but otherwise her naming was perfect.  Does not feel she needs thrombolytics.  He is ordering an CT angio.  If that is positive he will call back otherwise he recommends medical admission for MRI. [MB]  2206 EKG not crossing into epic.  Normal sinus rhythm normal intervals no acute ST-T changes. [MB]  2222 Patient feels improved but not back to baseline.  She is agreeable to plan for admission and MRI in the morning. [MB]    Clinical Course User Index [MB] Towana Ozell BROCKS, MD                                 Medical Decision Making Amount and/or Complexity of Data Reviewed Labs: ordered. Radiology:  ordered.  Risk Decision regarding hospitalization.   This patient complains of difficulty speaking; this involves an extensive number of treatment Options and is a complaint that carries with it a high risk of complications and morbidity. The differential includes stroke, seizure, anxiety, hypoglycemia  I ordered, reviewed and interpreted labs, which included CBC normal chemistries normal I ordered imaging studies which included CT head CTA head and neck and I independently    visualized and interpreted imaging which showed no acute findings Additional history obtained from EMS Previous records obtained and reviewed in epic no recent admissions I consulted teleneurology and discussed lab and imaging findings and discussed disposition.  Cardiac monitoring reviewed, sinus rhythm Social determinants considered, no significant barriers Critical Interventions: Patient of code stroke pathway including bedside evaluation and discussion with consultants and consideration for possible thrombolytic candidate  After the interventions stated above, I reevaluated the patient and found patient still be having some difficulty speaking although otherwise nonfocal Admission and further testing considered, neurology is not recommending TNK.  Feel she should be admitted for further stroke workup including MRI.  Patient in agreement with plan for admission.  Discussed with [facility] hospitalist Dr. Adefeso who will evaluate for admission.      Final diagnoses:  Speech disturbance, unspecified type    ED Discharge Orders     None          Towana Ozell BROCKS, MD 09/18/24 1018

## 2024-09-17 NOTE — H&P (Signed)
 History and Physical    Patient: Cynthia Alvarez FMW:[id number] DOB: 1971/03/21 DOA: 09/17/2024 DOS: the patient was seen and examined on 09/18/2024 PCP: Pcp, No  Patient coming from: Home  Chief Complaint:  Chief Complaint  Patient presents with   Aphasia   HPI: Cynthia Alvarez is a 53 y.o. female with no significant medical history who presents to the emergency department from home via EMS due to sudden onset of speech difficulty which started about 30 minutes PTA.  Patient states that she was just sitting on the couch when the problem started and she notified her husband.  Patient endorsed knowing what she wants to say, but had difficulty in being able to get the words out.  She denies any extremity weakness/numbness, facial droop, blurry vision and denies use of drugs or alcohol.  ED Course:  In the emergency department, BP was 150/86, other vital signs were within normal range.  Workup in the ED showed normal CBC except WBC of 11.8.  BMP was normal except potassium 3.1, blood glucose 111, creatinine 1.17, urine drug screen was negative, alcohol level was undetectable. CT of head and CT angiography of head and neck are negative for acute hemorrhage or large vessel occlusion. Teleneurology (Dr. Albina, [facility]) was consulted and recommended admitting patient for stroke rule out with MRI brain without contrast.  Review of Systems: Review of systems as noted in the HPI. All other systems reviewed and are negative.   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 She has never used smokeless tobacco. She reports that she does not drink alcohol and does not use drugs.   No Known Allergies  Family History  Problem Relation Age of Onset   Hypertension Mother    Heart attack Maternal Grandmother    Heart  attack Maternal Grandfather    Stroke Maternal Grandfather    Heart attack Paternal Grandmother    Heart attack Paternal Grandfather      Prior to Admission medications   Medication Sig Start Date End Date Taking? Authorizing Provider  ibuprofen  (ADVIL ) 800 MG tablet Take 1 tablet (800 mg total) by mouth 3 (three) times daily. 04/28/21   Ragan, Michael, FNP  Multiple Vitamins-Minerals (MULTIVITAMIN WITH MINERALS) tablet Take 1 tablet by mouth daily.      [provider]  Vitamin D , Ergocalciferol , (DRISDOL ) 50000 units CAPS capsule Take 1 capsule (50,000 Units total) by mouth every 7 (seven) days. Recheck Vitamin D  in 3 Months 02/13/16   Hessie Mallick, DO    Physical Exam: BP (!) 160/91 (BP Location: Right Arm)   Pulse 79   Temp 98.4 F (36.9 C) (Oral)   Resp 18   Ht 5' 6 (1.676 m)   Wt 77.1 kg   SpO2 100%   BMI 27.43 kg/m   General: 53 y.o. year-old female well developed well nourished in no acute distress.  Alert and oriented x3. HEENT: NCAT, EOMI Neck: Supple, trachea medial Cardiovascular: Regular rate and rhythm with no rubs or gallops.  No thyromegaly or JVD noted.  No lower extremity edema. 2/4 pulses in all 4 extremities. Respiratory: Clear to auscultation with no wheezes or rales. Good inspiratory effort. Abdomen: Soft, nontender nondistended with normal bowel sounds x4 quadrants. Muskuloskeletal: No cyanosis, clubbing or edema noted bilaterally Neuro: Normal speech at bedside (  dysarthria and occasional stuttering has resolved per patient).  CN II-XII intact, strength 5/5 x 4, sensation, reflexes intact Skin: No ulcerative lesions noted or rashes Psychiatry: Judgement and insight appear normal. Mood is appropriate for condition and setting          Labs on Admission:  Basic Metabolic Panel: Recent Labs  Lab 09/17/24 2120  NA 142  K 3.1*  CL 103  CO2 26  GLUCOSE 111*  BUN 15  CREATININE 1.17*  CALCIUM 9.6   Liver Function Tests: Recent Labs  Lab  09/17/24 2120  AST 24  ALT 17  ALKPHOS 71  BILITOT 0.3  PROT 7.5  ALBUMIN 4.8   No results for input(s): LIPASE, AMYLASE in the last 168 hours. No results for input(s): AMMONIA in the last 168 hours. CBC: Recent Labs  Lab 09/17/24 2120  WBC 11.8*  NEUTROABS 6.5  HGB 13.7  HCT 41.1  MCV 89.7  PLT 372   Cardiac Enzymes: No results for input(s): CKTOTAL, CKMB, CKMBINDEX, TROPONINI in the last 168 hours.  BNP (last 3 results) No results for input(s): BNP in the last 8760 hours.  ProBNP (last 3 results) No results for input(s): PROBNP in the last 8760 hours.  CBG: No results for input(s): GLUCAP in the last 168 hours.  Radiological Exams on Admission: CT ANGIO HEAD NECK W WO CM (CODE STROKE) Result Date: 09/17/2024 EXAM: CTA HEAD AND NECK WITH AND WITHOUT 09/17/2024 09:56:38 PM TECHNIQUE: CTA of the head and neck was performed with and without the administration of 75 mL of iohexol (OMNIPAQUE) 350 MG/ML injection. Multiplanar 2D and/or 3D reformatted images are provided for review. Automated exposure control, iterative reconstruction, and/or weight based adjustment of the mA/kV was utilized to reduce the radiation dose to as low as reasonably achievable. Stenosis of the internal carotid arteries measured using NASCET criteria. COMPARISON: CT from earlier the same day. CLINICAL HISTORY: Neuro deficit, acute, stroke suspected. Weakness and confusion. Perfusion will be done as soon as 18g iv started. FINDINGS: CTA NECK: AORTIC ARCH AND ARCH VESSELS: No dissection or arterial injury. No significant stenosis of the brachiocephalic or subclavian arteries. CERVICAL CAROTID ARTERIES: No dissection, arterial injury, or hemodynamically significant stenosis by NASCET criteria. CERVICAL VERTEBRAL ARTERIES: No dissection, arterial injury, or significant stenosis. LUNGS AND MEDIASTINUM: Unremarkable. SOFT TISSUES: No acute abnormality. BONES: No acute abnormality. CTA HEAD:  ANTERIOR CIRCULATION: No significant stenosis of the internal carotid arteries. No significant stenosis of the anterior cerebral arteries. No significant stenosis of the middle cerebral arteries. No aneurysm. POSTERIOR CIRCULATION: No significant stenosis of the posterior cerebral arteries. No significant stenosis of the basilar artery. No significant stenosis of the vertebral arteries. No aneurysm. OTHER: No dural venous sinus thrombosis on this non-dedicated study. IMPRESSION: 1. Normal CTA of the head and neck. No large vessel occlusion or other emergent finding. Electronically signed by: Morene Hoard MD 09/17/2024 10:13 PM EDT RP Workstation: [id number]   CT HEAD CODE STROKE WO CONTRAST (LKW 0-4.5h, LVO 0-24h) Result Date: 09/17/2024 EXAM: CT HEAD WITHOUT CONTRAST 09/17/2024 09:24:49 PM TECHNIQUE: CT of the head was performed without the administration of intravenous contrast. Automated exposure control, iterative reconstruction, and/or weight based adjustment of the mA/kV was utilized to reduce the radiation dose to as low as reasonably achievable. COMPARISON: None available. CLINICAL HISTORY: Neuro deficit, acute, stroke suspected. CODE STROKE DR BUTLER. [phone] SUDDEN ONSET WEAKNESS. FINDINGS: BRAIN AND VENTRICLES: No acute hemorrhage. No evidence of acute infarct. No hydrocephalus. No extra-axial collection. No  mass effect or midline shift. ORBITS: No acute abnormality. SINUSES: No acute abnormality. SOFT TISSUES AND SKULL: No acute soft tissue abnormality. No skull fracture. IMPRESSION: 1. No acute intracranial abnormality. Aspects  is 10. 2. Findings communicated by telephone  to Dr. Towana at 9:32 pm on 09/17/2024. Electronically signed by: Morene Hoard MD 09/17/2024 09:33 PM EDT RP Workstation: [id number]    EKG: I independently viewed the EKG done and my findings are as followed: Normal sinus rhythm at a rate of 95 bpm  Assessment/Plan Present on Admission:   Dysarthria  Principal Problem:   Dysarthria Active Problems:   Hypokalemia  Dysarthria rule out TIA vs ischemic stroke Patient will be admitted to telemetry unit CT head without contrast showed no acute intracranial abnormality CT angiography of head and neck with and without contrast showed no LVO or other emergent finding MRI of brain without contrast in the morning Continue fall precautions and neuro checks Bedside swallow eval by nursing prior to diet Continue SLP eval and treat Neurology will be consulted and we shall await further ruminations  Hypokalemia K+ 3.1, this will be replenished   DVT prophylaxis: SCDs (consider starting chemoprophylaxis if MRI of brain is negative)  Code Status: Full code  Family Communication: Husband at bedside (all questions answered to satisfaction)  Consults: Neurology  Severity of Illness: The appropriate patient status for this patient is OBSERVATION. Observation status is judged to be reasonable and necessary in order to provide the required intensity of service to ensure the patient's safety. The patient's presenting symptoms, physical exam findings, and initial radiographic and laboratory data in the context of their medical condition is felt to place them at decreased risk for further clinical deterioration. Furthermore, it is anticipated that the patient will be medically stable for discharge from the hospital within 2 midnights of admission.   Author: Mohamed Portlock, DO 09/18/2024 12:49 AM  For on call review [email or web address].

## 2024-09-18 ENCOUNTER — Other Ambulatory Visit (HOSPITAL_COMMUNITY): Payer: Self-pay

## 2024-09-18 ENCOUNTER — Encounter (HOSPITAL_COMMUNITY): Payer: Self-pay | Admitting: Internal Medicine

## 2024-09-18 ENCOUNTER — Observation Stay (HOSPITAL_COMMUNITY)

## 2024-09-18 DIAGNOSIS — R29818 Other symptoms and signs involving the nervous system: Secondary | ICD-10-CM | POA: Diagnosis not present

## 2024-09-18 DIAGNOSIS — E876 Hypokalemia: Secondary | ICD-10-CM | POA: Insufficient documentation

## 2024-09-18 DIAGNOSIS — R471 Dysarthria and anarthria: Secondary | ICD-10-CM | POA: Diagnosis not present

## 2024-09-18 DIAGNOSIS — G459 Transient cerebral ischemic attack, unspecified: Secondary | ICD-10-CM | POA: Diagnosis not present

## 2024-09-18 DIAGNOSIS — G43109 Migraine with aura, not intractable, without status migrainosus: Secondary | ICD-10-CM | POA: Diagnosis not present

## 2024-09-18 LAB — CBC
HCT: 38.4 % (ref 36.0–46.0)
Hemoglobin: 12.8 g/dL (ref 12.0–15.0)
MCH: 29.9 pg (ref 26.0–34.0)
MCHC: 33.3 g/dL (ref 30.0–36.0)
MCV: 89.7 fL (ref 80.0–100.0)
Platelets: 323 K/uL (ref 150–400)
RBC: 4.28 MIL/uL (ref 3.87–5.11)
RDW: 13.8 % (ref 11.5–15.5)
WBC: 8.2 K/uL (ref 4.0–10.5)
nRBC: 0 % (ref 0.0–0.2)

## 2024-09-18 LAB — COMPREHENSIVE METABOLIC PANEL WITH GFR
ALT: 15 U/L (ref 0–44)
AST: 20 U/L (ref 15–41)
Albumin: 4.1 g/dL (ref 3.5–5.0)
Alkaline Phosphatase: 61 U/L (ref 38–126)
Anion gap: 10 (ref 5–15)
BUN: 13 mg/dL (ref 6–20)
CO2: 24 mmol/L (ref 22–32)
Calcium: 9 mg/dL (ref 8.9–10.3)
Chloride: 107 mmol/L (ref 98–111)
Creatinine, Ser: 0.8 mg/dL (ref 0.44–1.00)
GFR, Estimated: >60 mL/min (ref >60)
Glucose, Bld: 100 mg/dL — ABNORMAL HIGH (ref 70–99)
Potassium: 3.9 mmol/L (ref 3.5–5.1)
Sodium: 141 mmol/L (ref 135–145)
Total Bilirubin: 0.2 mg/dL (ref 0.0–1.2)
Total Protein: 6.4 g/dL — ABNORMAL LOW (ref 6.5–8.1)

## 2024-09-18 LAB — LIPID PANEL
Cholesterol: 172 mg/dL (ref 0–200)
HDL: 44 mg/dL (ref >40)
LDL Cholesterol: 97 mg/dL (ref 0–99)
Total CHOL/HDL Ratio: 3.9 ratio
Triglycerides: 155 mg/dL — ABNORMAL HIGH (ref <150)
VLDL: 31 mg/dL (ref 0–40)

## 2024-09-18 LAB — MAGNESIUM: Magnesium: 2.6 mg/dL — ABNORMAL HIGH (ref 1.7–2.4)

## 2024-09-18 LAB — HIV ANTIBODY (ROUTINE TESTING W REFLEX): HIV Screen 4th Generation wRfx: NONREACTIVE

## 2024-09-18 LAB — PHOSPHORUS: Phosphorus: 3.2 mg/dL (ref 2.5–4.6)

## 2024-09-18 NOTE — Consult Note (Addendum)
 I connected with  Cynthia Alvarez on 09/18/24 by a video enabled telemedicine application and verified that I am speaking with the correct person using two identifiers.   I discussed the limitations of evaluation and management by telemedicine. The patient expressed understanding and agreed to proceed.   Location of patient: [facility] Location of physician: [facility]  Neurology Consultation Reason for Consult: Transient speech disturbance Referring Physician: Dr. Posey Maier  CC: Transient speech disturbance  History is obtained from: Patient, chart review, husband at bedside  HPI: Cynthia Alvarez is a 53 y.o. female with no significant past medical history except occasional migraines who presented with transient speech disturbance which lasted for about 2 to 4 hours and then completely resolved.  Denies any headache but states she does occasionally get migraines and felt like 1 coming on yesterday morning.  Has never had similar symptoms in the past.  Not on antiplatelets or anticoagulants.  Last known normal: 09/17/2024 at around 2030 No tPA and thrombectomy as mild symptoms and no LVO Event happened at home mRS 0   ROS: All other systems reviewed and negative except as noted in the HPI.   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 She has never used smokeless tobacco. She reports that she does not drink alcohol and does not use drugs.   Medications Prior to Admission  Medication Sig Dispense Refill Last Dose/Taking   ibuprofen  (ADVIL ) 800 MG tablet Take 1 tablet (800 mg total) by mouth 3 (three)  times daily. 30 tablet 0    Multiple Vitamins-Minerals (MULTIVITAMIN WITH MINERALS) tablet Take 1 tablet by mouth daily.        Vitamin D , Ergocalciferol , (DRISDOL ) 50000 units CAPS capsule Take 1 capsule (50,000 Units total) by mouth every 7 (seven) days. Recheck Vitamin D  in 3 Months 12 capsule 0       Exam: Current vital signs: BP 133/82 (BP Location: Left Arm)   Pulse 64   Temp 97.8 F (36.6 C) (Oral)   Resp 18   Ht 5' 6 (1.676 m)   Wt 77.1 kg   SpO2 99%   BMI 27.43 kg/m  Vital signs in last 24 hours: Temp:  [97.8 F (36.6 C)-98.5 F (36.9 C)] 97.8 F (36.6 C) (10/17 0428) Pulse Rate:  [64-93] 64 (10/17 0428) Resp:  [14-18] 18 (10/17 0428) BP: (133-168)/(82-95) 133/82 (10/17 0428) SpO2:  [96 %-100 %] 99 % (10/17 0428) FiO2 (%):  [21 %] 21 % (10/16 2332) Weight:  [77.1 kg] 77.1 kg (10/17 0021)   Physical Exam  Constitutional: Appears well-developed and well-nourished.  Psych: Affect appropriate to situation Neuro: AO x 3, no aphasia or dysarthria, cranial nerves grossly intact, antigravity strength in all 4 extremities without drift, sensory intact to light touch, FTN intact bilaterally  NIHSS 0  I have reviewed labs in epic and the results pertinent to this consultation are: CBC:  Recent Labs  Lab 09/17/24 2120 09/18/24 0354  WBC 11.8* 8.2  NEUTROABS 6.5  --   HGB 13.7 12.8  HCT 41.1 38.4  MCV 89.7 89.7  PLT  372 323    Basic Metabolic Panel:  Lab Results  Component Value Date   NA 141 09/18/2024   K 3.9 09/18/2024   CO2 24 09/18/2024   GLUCOSE 100 (H) 09/18/2024   BUN 13 09/18/2024   CREATININE 0.80 09/18/2024   CALCIUM 9.0 09/18/2024   GFRNONAA >60 09/18/2024   GFRAA 84 02/10/2016   Lipid Panel:  Lab Results  Component Value Date   LDLCALC 97 09/18/2024   HgbA1c: No results found for: HGBA1C Urine Drug Screen:     Component Value Date/Time   LABOPIA NEGATIVE 09/17/2024 2205   COCAINSCRNUR NEGATIVE 09/17/2024 2205   LABBENZ NEGATIVE  09/17/2024 2205   AMPHETMU NEGATIVE 09/17/2024 2205   THCU NEGATIVE 09/17/2024 2205   LABBARB NEGATIVE 09/17/2024 2205    Alcohol Level     Component Value Date/Time   [facility] <15 09/17/2024 2120     I have reviewed the images obtained:  CT head without contrast 09/17/2024: No acute abnormality  CTA head and neck with and without contrast 09/17/2024: No significant stenosis or large vessel occlusion  MRI brain without contrast 09/21/2024: No acute abnormality  ASSESSMENT/PLAN: 53 year old female with no significant past medical history except occasional migraines who presented with transient speech disturbance which completely resolved within 3 to 4 hours.  Transient speech disturbance - Differentials include migraine with aura versus less likely TIA (no risk factors, normal MRI and CTA)  Recommendations: - Discussed with patient about coming back to ER if symptoms recur and at that point may need to start on aspirin - However due to low suspicion for stroke and no significant risk factors, will hold off on starting any medications for now - Does not need any further inpatient neurology workup   Thank you for allowing us  to participate in the care of this patient. If you have any further questions, please contact  me or neurohospitalist.     I personally spent a total of 65 minutes in the care of the patient today including getting/reviewing separately obtained history, performing a medically appropriate exam/evaluation, counseling and educating, placing orders, referring and communicating with other health care professionals, documenting clinical information in the EHR, independently interpreting results, and coordinating care.         Arlin Krebs [facility] neurohospitalist

## 2024-09-18 NOTE — Progress Notes (Signed)
  Transition of Care [facility]) Screening Note   Patient Details  Name: Cynthia Alvarez Date of Birth: [birth year]   Transition of Care [facility]) CM/SW Contact:    Hoy DELENA Bigness, LCSW Phone Number: 09/18/2024, 9:40 AM    Transition of Care Department [facility]) has reviewed patient and no TOC needs have been identified at this time. We will continue to monitor patient advancement through interdisciplinary progression rounds. If new patient transition needs arise, please place a TOC consult.    09/18/24 0940  TOC Brief Assessment  Insurance and Status Reviewed  Patient has primary care physician No (PCP list added to AVS)  Home environment has been reviewed Single family home w/ spouse  Prior level of function: Independent  Prior/Current Home Services No current home services  Social Drivers of Health Review SDOH reviewed no interventions necessary  Readmission risk has been reviewed Yes  Transition of care needs no transition of care needs at this time

## 2024-09-18 NOTE — Discharge Summary (Signed)
 Physician Discharge Summary  Cynthia Alvarez FMW:[id number] DOB: 1971/07/03 DOA: 09/17/2024  PCP: Pcp, No  Admit date: 09/17/2024 Discharge date: 09/18/2024  Admitted From: Home Disposition: Home  Recommendations for Outpatient Follow-up:  Follow up with PCP as needed Follow up in ED if symptoms worsen or new appear   Home Health: No Equipment/Devices: None  Discharge Condition: Stable CODE STATUS: Full Diet recommendation: Heart healthy  Brief/Interim Summary:  Cynthia Alvarez is a 53 y.o. female with no significant medical history who presents to the emergency department from home via EMS due to sudden onset of speech difficulty which started about 30 minutes PTA.  Patient states that she was just sitting on the couch when the problem started and she notified her husband.  Patient endorsed knowing what she wants to say, but had difficulty in being able to get the words out.  She denies any extremity weakness/numbness, facial droop, blurry vision and denies use of drugs or alcohol.   Vital signs are stable in the ER.  Laboratory are notable for mild hypokalemia.  Patient underwent extensive stroke workup including CT angiogram of head and neck which was negative for any significant abnormalities.  Also brain MRI was negative.  Patient remained completely asymptomatic and was discharged home.  She was evaluated by neurology who recommended no antiplatelet therapy at this time.  She is advised to return to the ER if recurrence of neurological symptoms.  Her symptoms were felt to be secondary to possible migraine with aura versus TIA which is less likely given paucity of risk factors   Discharge Diagnoses:  Principal Problem:   Dysarthria Active Problems:   Hypokalemia    Discharge Instructions  Discharge Instructions     Discharge instructions   Complete by: As directed    1.  Return to the ER if recurrence of symptoms   Increase activity slowly   Complete by: As directed        Allergies as of 09/18/2024   No Known Allergies      Medication List     TAKE these medications    ibuprofen  800 MG tablet Commonly known as: ADVIL  Take 1 tablet (800 mg total) by mouth 3 (three) times daily.   multivitamin with minerals tablet Take 1 tablet by mouth daily.   Vitamin D  (Ergocalciferol ) 1.25 MG (50000 UNIT) Caps capsule Commonly known as: DRISDOL  Take 1 capsule (50,000 Units total) by mouth every 7 (seven) days. Recheck Vitamin D  in 3 Months        No Known Allergies  Consultations:    Procedures/Studies: MR BRAIN WO CONTRAST Result Date: 09/18/2024 EXAM: MRI BRAIN WITHOUT CONTRAST 09/18/2024 08:11:00 AM TECHNIQUE: Multiplanar multisequence MRI of the head/brain was performed without the administration of intravenous contrast. COMPARISON: CT head and CTA head and neck 09/17/2024. CLINICAL HISTORY: 53 year old female. Neuro deficit, acute, stroke suspected. FINDINGS: BRAIN AND VENTRICLES: Normal flow voids. No acute infarct. No intracranial hemorrhage. No mass. No midline shift. No hydrocephalus. Mildly asymmetry of the lateral ventricles appears to be normal variation. Normal brain volume for age. No ventriculomegaly. Normal for age gray and white matter signal. No encephalomalacia or chronic cerebral blood products identified. The sella is unremarkable. ORBITS: No acute abnormality. SINUSES AND MASTOIDS: No acute abnormality. BONES AND SOFT TISSUES: Normal marrow signal. No acute soft tissue abnormality. IMPRESSION: 1. Normal for age non-contrast MRI appearance of the brain. Electronically signed by: Helayne Hurst MD 09/18/2024 08:17 AM EDT RP Workstation: [id number]   CT ANGIO HEAD NECK  W WO CM (CODE STROKE) Result Date: 09/17/2024 EXAM: CTA HEAD AND NECK WITH AND WITHOUT 09/17/2024 09:56:38 PM TECHNIQUE: CTA of the head and neck was performed with and without the administration of 75 mL of iohexol (OMNIPAQUE) 350 MG/ML injection. Multiplanar 2D and/or  3D reformatted images are provided for review. Automated exposure control, iterative reconstruction, and/or weight based adjustment of the mA/kV was utilized to reduce the radiation dose to as low as reasonably achievable. Stenosis of the internal carotid arteries measured using NASCET criteria. COMPARISON: CT from earlier the same day. CLINICAL HISTORY: Neuro deficit, acute, stroke suspected. Weakness and confusion. Perfusion will be done as soon as 18g iv started. FINDINGS: CTA NECK: AORTIC ARCH AND ARCH VESSELS: No dissection or arterial injury. No significant stenosis of the brachiocephalic or subclavian arteries. CERVICAL CAROTID ARTERIES: No dissection, arterial injury, or hemodynamically significant stenosis by NASCET criteria. CERVICAL VERTEBRAL ARTERIES: No dissection, arterial injury, or significant stenosis. LUNGS AND MEDIASTINUM: Unremarkable. SOFT TISSUES: No acute abnormality. BONES: No acute abnormality. CTA HEAD: ANTERIOR CIRCULATION: No significant stenosis of the internal carotid arteries. No significant stenosis of the anterior cerebral arteries. No significant stenosis of the middle cerebral arteries. No aneurysm. POSTERIOR CIRCULATION: No significant stenosis of the posterior cerebral arteries. No significant stenosis of the basilar artery. No significant stenosis of the vertebral arteries. No aneurysm. OTHER: No dural venous sinus thrombosis on this non-dedicated study. IMPRESSION: 1. Normal CTA of the head and neck. No large vessel occlusion or other emergent finding. Electronically signed by: Morene Hoard MD 09/17/2024 10:13 PM EDT RP Workstation: [id number]   CT HEAD CODE STROKE WO CONTRAST (LKW 0-4.5h, LVO 0-24h) Result Date: 09/17/2024 EXAM: CT HEAD WITHOUT CONTRAST 09/17/2024 09:24:49 PM TECHNIQUE: CT of the head was performed without the administration of intravenous contrast. Automated exposure control, iterative reconstruction, and/or weight based adjustment of the mA/kV was  utilized to reduce the radiation dose to as low as reasonably achievable. COMPARISON: None available. CLINICAL HISTORY: Neuro deficit, acute, stroke suspected. CODE STROKE DR BUTLER. [phone] SUDDEN ONSET WEAKNESS. FINDINGS: BRAIN AND VENTRICLES: No acute hemorrhage. No evidence of acute infarct. No hydrocephalus. No extra-axial collection. No mass effect or midline shift. ORBITS: No acute abnormality. SINUSES: No acute abnormality. SOFT TISSUES AND SKULL: No acute soft tissue abnormality. No skull fracture. IMPRESSION: 1. No acute intracranial abnormality. Aspects  is 10. 2. Findings communicated by telephone  to Dr. Towana at 9:32 pm on 09/17/2024. Electronically signed by: Morene Hoard MD 09/17/2024 09:33 PM EDT RP Workstation: [id number]      Subjective:   Discharge Exam: Vitals:   09/18/24 0021 09/18/24 0428  BP: (!) 160/91 133/82  Pulse: 79 64  Resp: 18 18  Temp: 98.4 F (36.9 C) 97.8 F (36.6 C)  SpO2: 100% 99%    General: Pt is alert, awake, not in acute distress Cardiovascular: rate controlled, S1/S2 + Respiratory: bilateral decreased breath sounds at bases Abdominal: Soft, NT, ND, bowel sounds + Extremities: no edema, no cyanosis    The results of significant diagnostics from this hospitalization (including imaging, microbiology, ancillary and laboratory) are listed below for reference.     Microbiology: No results found for this or any previous visit (from the past 240 hours).   Labs: BNP (last 3 results) No results for input(s): BNP in the last 8760 hours. Basic Metabolic Panel: Recent Labs  Lab 09/17/24 2120 09/18/24 0354  NA 142 141  K 3.1* 3.9  CL 103 107  CO2 26 24  GLUCOSE 111*  100*  BUN 15 13  CREATININE 1.17* 0.80  CALCIUM 9.6 9.0  MG  --  2.6*  PHOS  --  3.2   Liver Function Tests: Recent Labs  Lab 09/17/24 2120 09/18/24 0354  AST 24 20  ALT 17 15  ALKPHOS 71 61  BILITOT 0.3 0.2  PROT 7.5 6.4*  ALBUMIN 4.8 4.1   No  results for input(s): LIPASE, AMYLASE in the last 168 hours. No results for input(s): AMMONIA in the last 168 hours. CBC: Recent Labs  Lab 09/17/24 2120 09/18/24 0354  WBC 11.8* 8.2  NEUTROABS 6.5  --   HGB 13.7 12.8  HCT 41.1 38.4  MCV 89.7 89.7  PLT 372 323   Cardiac Enzymes: No results for input(s): CKTOTAL, CKMB, CKMBINDEX, TROPONINI in the last 168 hours. BNP: Invalid input(s): POCBNP CBG: No results for input(s): GLUCAP in the last 168 hours. D-Dimer No results for input(s): DDIMER in the last 72 hours. Hgb A1c No results for input(s): HGBA1C in the last 72 hours. Lipid Profile Recent Labs    09/18/24 0354  CHOL 172  HDL 44  LDLCALC 97  TRIG 155*  CHOLHDL 3.9   Thyroid function studies No results for input(s): TSH, T4TOTAL, T3FREE, THYROIDAB in the last 72 hours.  Invalid input(s): FREET3 Anemia work up No results for input(s): VITAMINB12, FOLATE, FERRITIN, TIBC, IRON, RETICCTPCT in the last 72 hours. Urinalysis    Component Value Date/Time   COLORURINE yellow 10/13/2009 1700   APPEARANCEUR Clear 10/13/2009 1700   LABSPEC 1.025 10/13/2009 1700   PHURINE 5.5 10/13/2009 1700   HGBUR moderate 10/13/2009 1700   BILIRUBINUR negative 10/13/2009 1700   UROBILINOGEN 0.2 10/13/2009 1700   NITRITE negative 10/13/2009 1700   Sepsis Labs Recent Labs  Lab 09/17/24 2120 09/18/24 0354  WBC 11.8* 8.2   Microbiology No results found for this or any previous visit (from the past 240 hours).   Time coordinating discharge: 35 minutes  SIGNED:   Zong Mcquarrie, MD  [facility] 09/18/2024, 2:36 PM

## 2024-09-18 NOTE — Plan of Care (Signed)

## 2024-09-18 NOTE — Progress Notes (Signed)
 SLP Cancellation Note  Patient Details Name: Cynthia Alvarez MRN: [id number] DOB: 10/04/1971   Cancelled treatment:       Reason Eval/Treat Not Completed: SLP screened, no needs identified, will sign off. Speech disturbances resolved 2-4 hour after onset. No ST needs indicated at this time, our service will sign off. Thank you for this referral,  Lanissa Cashen H. Clois KILLIAN, CCC-SLP Speech Language Pathologist    Raguel VEAR Clois 09/18/2024, 12:51 PM

## 2024-09-18 NOTE — Discharge Instructions (Signed)

## 2024-09-21 LAB — POCT I-STAT, CHEM 8
BUN: 16 mg/dL (ref 6–20)
Calcium, Ion: 1.19 mmol/L (ref 1.15–1.40)
Chloride: 103 mmol/L (ref 98–111)
Creatinine, Ser: 1.2 mg/dL — ABNORMAL HIGH (ref 0.44–1.00)
Glucose, Bld: 112 mg/dL — ABNORMAL HIGH (ref 70–99)
HCT: 40 % (ref 36.0–46.0)
Hemoglobin: 13.6 g/dL (ref 12.0–15.0)
Potassium: 3.1 mmol/L — ABNORMAL LOW (ref 3.5–5.1)
Sodium: 143 mmol/L (ref 135–145)
TCO2: 27 mmol/L (ref 22–32)

## 2024-09-25 LAB — GLUCOSE, CAPILLARY: Glucose-Capillary: 110 mg/dL — ABNORMAL HIGH (ref 70–99)

## 2024-12-26 ENCOUNTER — Encounter: Payer: Self-pay | Admitting: Emergency Medicine

## 2024-12-26 ENCOUNTER — Other Ambulatory Visit: Payer: Self-pay

## 2024-12-26 ENCOUNTER — Ambulatory Visit
Admission: EM | Admit: 2024-12-26 | Discharge: 2024-12-26 | Disposition: A | Discharge status: Home / Self Care | Attending: Family Medicine | Admitting: Family Medicine

## 2024-12-26 DIAGNOSIS — J069 Acute upper respiratory infection, unspecified: Secondary | ICD-10-CM

## 2024-12-26 LAB — POC COVID19/FLU A&B COMBO
Covid Antigen, POC: NEGATIVE
Influenza A Antigen, POC: NEGATIVE
Influenza B Antigen, POC: NEGATIVE

## 2024-12-26 MED ORDER — HYDROCODONE BIT-HOMATROP MBR 5-1.5 MG/5ML PO SOLN: 5.0000 mL | Freq: Four times a day (QID) | ORAL | 0 refills | Status: AC | PRN

## 2024-12-26 MED ORDER — AZITHROMYCIN 250 MG PO TABS: 250.0000 mg | ORAL_TABLET | Freq: Every day | ORAL | 0 refills | Status: AC

## 2024-12-26 NOTE — ED Provider Notes (Signed)
 " [facility]   [id number] 12/26/24 Arrival Time: 9173  ASSESSMENT & PLAN:  1. Viral URI with cough    Results for orders placed or performed during the hospital encounter of 12/26/24  POC Covid19/Flu A&B Antigen   Collection Time: 12/26/24  9:43 AM  Result Value Ref Range   Influenza A Antigen, POC Negative Negative   Influenza B Antigen, POC Negative Negative   Covid Antigen, POC Negative Negative   OTC symptom care as needed.  Given duration of symptoms, will cover with Zithromax . Meds ordered this encounter  Medications   HYDROcodone  bit-homatropine (HYCODAN) 5-1.5 MG/5ML syrup    Sig: Take 5 mLs by mouth every 6 (six) hours as needed for cough.    Dispense:  90 mL    Refill:  0   azithromycin  (ZITHROMAX ) 250 MG tablet    Sig: Take 1 tablet (250 mg total) by mouth daily. Take first 2 tablets together, then 1 every day until finished.    Dispense:  6 tablet    Refill:  0     Follow-up Information     [facility].   Specialty: Urgent Care Why: If worsening or failing to improve as anticipated. Contact information: [address] [place]  [id number] [phone]                Reviewed expectations re: course of current medical issues. Questions answered. Outlined signs and symptoms indicating need for more acute intervention. Understanding verbalized. After Visit Summary given.   SUBJECTIVE: History from: Patient. Cynthia Alvarez is a 54 y.o. female. Pt reports intermittent productive cough, pain with deep breath, and dyspnea with coughing spells, runny nose, neck pain, sore throat for past week; denies fever. Cough is really bothering her. Pt inquiring about viral testing. Normal PO intake without n/v/d.  OBJECTIVE:  Vitals:   12/26/24 0923  BP: 136/87  Pulse: 91  Resp: 20  Temp: 98.4 F (36.9 C)  TempSrc: Oral  SpO2: 95%    General appearance: alert; no distress Eyes: PERRLA;  EOMI; conjunctiva normal HENT: [place]; AT; with nasal congestion Neck: supple  Lungs: speaks full sentences without difficulty; unlabored Extremities: no edema Skin: warm and dry Neurologic: normal gait Psychological: alert and cooperative; normal mood and affect  Labs: Results for orders placed or performed during the hospital encounter of 12/26/24  POC Covid19/Flu A&B Antigen   Collection Time: 12/26/24  9:43 AM  Result Value Ref Range   Influenza A Antigen, POC Negative Negative   Influenza B Antigen, POC Negative Negative   Covid Antigen, POC Negative Negative   Labs Reviewed  POC COVID19/FLU A&B COMBO    Imaging: No results found.  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   Social History   Socioeconomic History   Marital status: Married    Spouse name: Not on file   Number of children: Not on file   Years of education: Not on file   Highest education level: Not on file  Occupational History   Not on file  Tobacco Use   Smoking status: Never   Smokeless tobacco: Never  Substance and Sexual Activity   Alcohol use: No   Drug use: No   Sexual activity: Yes    Birth control/protection: None  Other Topics Concern   Not on file  Social History Narrative   Not on file   Social Drivers of Health  Tobacco Use: Low Risk (12/26/2024)   Patient History    Smoking Tobacco Use: Never    Smokeless Tobacco Use: Never    Passive Exposure: Not on file  Financial Resource Strain: Not on file  Food Insecurity: Not on file  Transportation Needs: Not on file  Physical Activity: Not on file  Stress: Not on file  Social Connections: Not on file  Intimate Partner Violence: Not on file  Depression (EYV7-0): Not on file  Alcohol Screen: Not on file  Housing: Not on file  Utilities: Not on file  Health Literacy: Not on file   Family History  Problem Relation Age of Onset   Hypertension Mother    Heart  attack Maternal Grandmother    Heart attack Maternal Grandfather    Stroke Maternal Grandfather    Heart attack Paternal Grandmother    Heart attack Paternal Grandfather    Past Surgical History:  Procedure Laterality Date   WISDOM TOOTH EXTRACTION        [1] No Known Allergies    Rolinda Rogue, MD 12/26/24 1516  "

## 2024-12-26 NOTE — ED Triage Notes (Addendum)
 Pt reports intermittent productive cough, pain with deep breath, and dyspnea with coughing spells, runny nose, neck pain, sore throat for last several days.denies any known fevers. Otc medication has not helped with cough.  Pt inquiring about viral testing.
